# Patient Record
Sex: Female | Born: 1985 | Hispanic: No | Marital: Married | Smoking: Current some day smoker
Health system: Southern US, Community
[De-identification: ages and names within clinical notes are randomized; demographics above are authoritative.]

## PROBLEM LIST (undated history)

## (undated) DIAGNOSIS — T783XXA Angioneurotic edema, initial encounter: Secondary | ICD-10-CM

## (undated) DIAGNOSIS — J45909 Unspecified asthma, uncomplicated: Secondary | ICD-10-CM

## (undated) DIAGNOSIS — B009 Herpesviral infection, unspecified: Secondary | ICD-10-CM

## (undated) DIAGNOSIS — L509 Urticaria, unspecified: Secondary | ICD-10-CM

## (undated) HISTORY — PX: WISDOM TOOTH EXTRACTION: SHX21

## (undated) HISTORY — DX: Angioneurotic edema, initial encounter: T78.3XXA

## (undated) HISTORY — PX: LEEP: SHX91

## (undated) HISTORY — DX: Urticaria, unspecified: L50.9

## (undated) HISTORY — DX: Unspecified asthma, uncomplicated: J45.909

---

## 2009-01-03 ENCOUNTER — Other Ambulatory Visit: Admission: RE | Admit: 2009-01-03 | Discharge: 2009-01-03 | Payer: Self-pay | Admitting: Family Medicine

## 2009-11-12 ENCOUNTER — Emergency Department (HOSPITAL_COMMUNITY): Admission: EM | Admit: 2009-11-12 | Discharge: 2009-11-12 | Payer: Self-pay | Admitting: Family Medicine

## 2010-05-06 ENCOUNTER — Other Ambulatory Visit: Admission: RE | Admit: 2010-05-06 | Discharge: 2010-05-06 | Payer: Self-pay | Admitting: Family Medicine

## 2011-06-19 ENCOUNTER — Other Ambulatory Visit (HOSPITAL_COMMUNITY)
Admission: RE | Admit: 2011-06-19 | Discharge: 2011-06-19 | Disposition: A | Discharge status: Home / Self Care | Payer: Self-pay | Source: Ambulatory Visit | Attending: Family Medicine | Admitting: Family Medicine

## 2011-06-19 DIAGNOSIS — Z0000 Encounter for general adult medical examination without abnormal findings: Secondary | ICD-10-CM | POA: Insufficient documentation

## 2011-10-14 ENCOUNTER — Emergency Department (INDEPENDENT_AMBULATORY_CARE_PROVIDER_SITE_OTHER)
Admission: EM | Admit: 2011-10-14 | Discharge: 2011-10-14 | Disposition: A | Discharge status: Nursing Home (Includes ICF) | Payer: 59 | Source: Home / Self Care | Attending: Family Medicine | Admitting: Family Medicine

## 2011-10-14 ENCOUNTER — Encounter: Payer: Self-pay | Admitting: Emergency Medicine

## 2011-10-14 DIAGNOSIS — J069 Acute upper respiratory infection, unspecified: Secondary | ICD-10-CM

## 2011-10-14 HISTORY — DX: Herpesviral infection, unspecified: B00.9

## 2011-10-14 MED ORDER — HYDROCODONE-ACETAMINOPHEN 7.5-325 MG/15ML PO SOLN: 15.0000 mL | Freq: Three times a day (TID) | ORAL | Status: AC | PRN

## 2011-10-14 MED ORDER — FEXOFENADINE-PSEUDOEPHED ER 60-120 MG PO TB12: 1.0000 | ORAL_TABLET | Freq: Two times a day (BID) | ORAL | Status: AC

## 2011-10-14 MED ORDER — AZITHROMYCIN 250 MG PO TABS: 250.0000 mg | ORAL_TABLET | Freq: Every day | ORAL | Status: AC

## 2011-10-14 NOTE — ED Provider Notes (Signed)
History     CSN: [id number] Arrival date & time: 10/14/2011  5:35 PM   First MD Initiated Contact with Patient 10/14/11 1618      Chief Complaint  Patient presents with  . URI    (Consider location/radiation/quality/duration/timing/severity/associated sxs/prior treatment) Patient is a 25 y.o. female presenting with URI. The history is provided by the patient.  URI The primary symptoms include fever, headaches, sore throat, cough and myalgias. Primary symptoms do not include ear pain, swollen glands, wheezing, abdominal pain, nausea, vomiting or rash. Primary symptoms comment: has beed afebrile for 24 hours with no medications The current episode started 3 to 5 days ago. This is a new problem.  The headache is not associated with neck stiffness.  The onset of the illness is associated with exposure to sick contacts. Symptoms associated with the illness include sinus pressure, congestion and rhinorrhea.    Past Medical History  Diagnosis Date  . HSV infection     Past Surgical History  Procedure Date  . Leep   . Wisdom tooth extraction     Family History  Problem Relation Age of Onset  . Hypertension Mother   . Hyperlipidemia Mother   . Hyperlipidemia Father   . Hypertension Father   . Cancer Other     History  Substance Use Topics  . Smoking status: Never Smoker   . Smokeless tobacco: Not on file  . Alcohol Use: Yes    OB History    Grav Para Term Preterm Abortions TAB SAB Ect Mult Living                  Review of Systems  Constitutional: Positive for fever and appetite change.  HENT: Positive for congestion, sore throat, rhinorrhea and sinus pressure. Negative for ear pain, neck pain and neck stiffness.   Respiratory: Positive for cough. Negative for chest tightness, shortness of breath and wheezing.   Cardiovascular: Negative for chest pain.  Gastrointestinal: Negative for nausea, vomiting, abdominal pain and diarrhea.  Musculoskeletal: Positive for  myalgias.  Skin: Negative for rash.  Neurological: Positive for headaches.    Allergies  Review of patient's allergies indicates no known allergies.  Home Medications   Current Outpatient Rx  Name Route Sig Dispense Refill  . MULTIVITAMINS PO TABS Oral Take 1 tablet by mouth daily.      Marland Kitchen OVER THE COUNTER MEDICATION  Vitamin c and zinc and cough medicine     . AZITHROMYCIN 250 MG PO TABS Oral Take 1 tablet (250 mg total) by mouth daily. Take first 2 tablets together, then 1 every day until finished. 6 tablet 0    Hold prescription and only filly new onset of feve ...  . FEXOFENADINE-PSEUDOEPHED ER 60-120 MG PO TB12 Oral Take 1 tablet by mouth every 12 (twelve) hours. 30 tablet 0  . HYDROCODONE-ACETAMINOPHEN 7.5-325 MG/15ML PO SOLN Oral Take 15 mLs by mouth every 8 (eight) hours as needed for pain. 120 mL 0  . IBUPROFEN 800 MG PO TABS Oral Take 800 mg by mouth every 8 (eight) hours as needed.      Marland Kitchen VALACYCLOVIR HCL 500 MG PO TABS Oral Take 500 mg by mouth 2 (two) times daily.        BP 114/75  Pulse 86  Temp(Src) 98.3 F (36.8 C) (Oral)  Resp 20  SpO2 96%  LMP 10/14/2011  Physical Exam  Nursing note and vitals reviewed. Constitutional: She is oriented to person, place, and time. She appears well-developed and  well-nourished. No distress.  HENT:  Head: Normocephalic and atraumatic.       Nasal congestion with erythema and swelling of nasal turbinates. Reported bimaxillary sinus pressure. Significant pharyngeal erythema, no exudates. Uvula central. No trismus.  TMs increased vascular markings and some how dulness but no swelling or bulging.  Eyes: Conjunctivae and EOM are normal. Pupils are equal, round, and reactive to light.  Neck: Normal range of motion. Neck supple.  Cardiovascular: Normal rate, regular rhythm and normal heart sounds.   No murmur heard. Pulmonary/Chest: Effort normal and breath sounds normal. No respiratory distress. She has no wheezes. She has no rales.  She exhibits no tenderness.  Lymphadenopathy:    She has cervical adenopathy.  Neurological: She is alert and oriented to person, place, and time.  Skin: No rash noted.  Psychiatric: She has a normal mood and affect.    ED Course  Procedures (including critical care time)  Labs Reviewed - No data to display No results found.   1. URI (upper respiratory infection)       MDM  Likely viral URI as per patient request agreed to give a hold prescription for azithromycin that pt will fill if recurent fever and worsening symptoms after 7 days from day one of her symptoms.      Sharin Grave, MD 10/16/11 1229

## 2011-10-14 NOTE — ED Notes (Signed)
Fever--101 on saturday , cough, general aches.  Onset Saturday am.  Eating ok, tolerating po's

## 2012-07-08 ENCOUNTER — Other Ambulatory Visit (HOSPITAL_COMMUNITY)
Admission: RE | Admit: 2012-07-08 | Discharge: 2012-07-08 | Disposition: A | Discharge status: Home / Self Care | Payer: 59 | Source: Ambulatory Visit | Attending: Family Medicine | Admitting: Family Medicine

## 2012-07-08 DIAGNOSIS — Z0000 Encounter for general adult medical examination without abnormal findings: Secondary | ICD-10-CM | POA: Insufficient documentation

## 2012-07-09 ENCOUNTER — Other Ambulatory Visit: Payer: Self-pay | Admitting: Family Medicine

## 2012-07-09 DIAGNOSIS — Z87898 Personal history of other specified conditions: Secondary | ICD-10-CM

## 2012-07-09 DIAGNOSIS — N63 Unspecified lump in unspecified breast: Secondary | ICD-10-CM

## 2012-07-16 ENCOUNTER — Ambulatory Visit
Admission: RE | Admit: 2012-07-16 | Discharge: 2012-07-16 | Disposition: A | Discharge status: Home / Self Care | Payer: 59 | Source: Ambulatory Visit | Attending: Family Medicine | Admitting: Family Medicine

## 2012-07-16 DIAGNOSIS — N63 Unspecified lump in unspecified breast: Secondary | ICD-10-CM

## 2012-07-16 DIAGNOSIS — Z87898 Personal history of other specified conditions: Secondary | ICD-10-CM

## 2012-07-22 ENCOUNTER — Other Ambulatory Visit: Payer: Self-pay | Admitting: Family Medicine

## 2012-07-22 DIAGNOSIS — D249 Benign neoplasm of unspecified breast: Secondary | ICD-10-CM

## 2012-07-23 ENCOUNTER — Other Ambulatory Visit: Payer: 59

## 2012-07-26 ENCOUNTER — Ambulatory Visit
Admission: RE | Admit: 2012-07-26 | Discharge: 2012-07-26 | Disposition: A | Discharge status: Home / Self Care | Payer: 59 | Source: Ambulatory Visit | Attending: Family Medicine | Admitting: Family Medicine

## 2012-07-26 DIAGNOSIS — D249 Benign neoplasm of unspecified breast: Secondary | ICD-10-CM

## 2013-11-28 ENCOUNTER — Other Ambulatory Visit (HOSPITAL_COMMUNITY)
Admission: RE | Admit: 2013-11-28 | Discharge: 2013-11-28 | Disposition: A | Discharge status: Home / Self Care | Payer: 59 | Source: Ambulatory Visit | Attending: Family Medicine | Admitting: Family Medicine

## 2013-11-28 ENCOUNTER — Other Ambulatory Visit: Payer: Self-pay | Admitting: Family Medicine

## 2013-11-28 DIAGNOSIS — Z01419 Encounter for gynecological examination (general) (routine) without abnormal findings: Secondary | ICD-10-CM | POA: Insufficient documentation

## 2014-04-25 ENCOUNTER — Other Ambulatory Visit (HOSPITAL_COMMUNITY)
Admission: RE | Admit: 2014-04-25 | Discharge: 2014-04-25 | Disposition: A | Discharge status: Home / Self Care | Payer: 59 | Source: Ambulatory Visit | Attending: Family Medicine | Admitting: Family Medicine

## 2014-04-25 ENCOUNTER — Other Ambulatory Visit: Payer: Self-pay | Admitting: Family Medicine

## 2014-04-25 DIAGNOSIS — R87810 Cervical high risk human papillomavirus (HPV) DNA test positive: Secondary | ICD-10-CM | POA: Insufficient documentation

## 2014-04-25 DIAGNOSIS — Z01419 Encounter for gynecological examination (general) (routine) without abnormal findings: Secondary | ICD-10-CM | POA: Insufficient documentation

## 2014-04-25 DIAGNOSIS — Z1151 Encounter for screening for human papillomavirus (HPV): Secondary | ICD-10-CM | POA: Insufficient documentation

## 2014-04-27 LAB — CYTOLOGY - PAP

## 2014-06-05 ENCOUNTER — Other Ambulatory Visit: Payer: Self-pay | Admitting: Obstetrics & Gynecology

## 2015-06-07 ENCOUNTER — Other Ambulatory Visit: Payer: Self-pay | Admitting: Obstetrics & Gynecology

## 2015-06-07 ENCOUNTER — Other Ambulatory Visit (HOSPITAL_COMMUNITY)
Admission: RE | Admit: 2015-06-07 | Discharge: 2015-06-07 | Disposition: A | Discharge status: Home / Self Care | Payer: 59 | Source: Ambulatory Visit | Attending: Obstetrics and Gynecology | Admitting: Obstetrics and Gynecology

## 2015-06-07 DIAGNOSIS — Z01419 Encounter for gynecological examination (general) (routine) without abnormal findings: Secondary | ICD-10-CM | POA: Insufficient documentation

## 2015-06-11 LAB — CYTOLOGY - PAP

## 2015-11-27 DIAGNOSIS — Z23 Encounter for immunization: Secondary | ICD-10-CM | POA: Diagnosis not present

## 2016-05-07 DIAGNOSIS — D1039 Benign neoplasm of other parts of mouth: Secondary | ICD-10-CM | POA: Diagnosis not present

## 2016-07-07 ENCOUNTER — Telehealth: Payer: 59 | Admitting: Family

## 2016-07-07 DIAGNOSIS — J069 Acute upper respiratory infection, unspecified: Secondary | ICD-10-CM

## 2016-07-07 MED ORDER — AZITHROMYCIN 250 MG PO TABS: ORAL_TABLET | ORAL | 0 refills | Status: DC

## 2016-07-07 MED ORDER — BENZONATATE 100 MG PO CAPS: 100.0000 mg | ORAL_CAPSULE | Freq: Two times a day (BID) | ORAL | 0 refills | Status: DC | PRN

## 2016-07-07 NOTE — Addendum Note (Signed)
Addended by: Evelina Dun A on: 07/07/2016 07:39 PM   Modules accepted: Orders

## 2016-07-07 NOTE — Progress Notes (Signed)

## 2016-07-17 DIAGNOSIS — Z01411 Encounter for gynecological examination (general) (routine) with abnormal findings: Secondary | ICD-10-CM | POA: Diagnosis not present

## 2016-07-17 DIAGNOSIS — R3915 Urgency of urination: Secondary | ICD-10-CM | POA: Diagnosis not present

## 2016-08-21 DIAGNOSIS — Z0000 Encounter for general adult medical examination without abnormal findings: Secondary | ICD-10-CM | POA: Diagnosis not present

## 2016-08-21 DIAGNOSIS — E559 Vitamin D deficiency, unspecified: Secondary | ICD-10-CM | POA: Diagnosis not present

## 2016-09-04 DIAGNOSIS — E559 Vitamin D deficiency, unspecified: Secondary | ICD-10-CM | POA: Diagnosis not present

## 2016-09-04 DIAGNOSIS — Z13220 Encounter for screening for lipoid disorders: Secondary | ICD-10-CM | POA: Diagnosis not present

## 2016-09-04 DIAGNOSIS — Z131 Encounter for screening for diabetes mellitus: Secondary | ICD-10-CM | POA: Diagnosis not present

## 2016-09-04 DIAGNOSIS — Z0000 Encounter for general adult medical examination without abnormal findings: Secondary | ICD-10-CM | POA: Diagnosis not present

## 2016-09-04 LAB — VITAMIN D 25 HYDROXY (VIT D DEFICIENCY, FRACTURES): Vit D, 25-Hydroxy: 28.2

## 2016-09-04 LAB — LIPID PANEL
CHOLESTEROL: 150 (ref 0–200)
HDL: 58 (ref 35–70)
LDL Cholesterol: 79
Triglycerides: 60 (ref 40–160)

## 2016-09-04 LAB — BASIC METABOLIC PANEL: Glucose: 90

## 2018-06-07 ENCOUNTER — Ambulatory Visit (INDEPENDENT_AMBULATORY_CARE_PROVIDER_SITE_OTHER): Payer: 59 | Admitting: Family Medicine

## 2018-06-07 ENCOUNTER — Encounter: Payer: Self-pay | Admitting: Family Medicine

## 2018-06-07 VITALS — BP 119/76 | HR 68 | Ht 66.0 in | Wt 130.9 lb

## 2018-06-07 DIAGNOSIS — R062 Wheezing: Secondary | ICD-10-CM | POA: Diagnosis not present

## 2018-06-07 DIAGNOSIS — Z716 Tobacco abuse counseling: Secondary | ICD-10-CM

## 2018-06-07 DIAGNOSIS — J3089 Other allergic rhinitis: Secondary | ICD-10-CM | POA: Insufficient documentation

## 2018-06-07 DIAGNOSIS — F17209 Nicotine dependence, unspecified, with unspecified nicotine-induced disorders: Secondary | ICD-10-CM

## 2018-06-07 NOTE — Progress Notes (Signed)
New patient office visit note:  Impression and Recommendations:    1. Mild Exp Wheezes   2. Environmental and seasonal allergies   3. Tobacco use disorder, continuous   4. Tobacco abuse counseling      Lifestyle Please realize, EXERCISE IS MEDICINE! -  American Heart Association [facility]) guidelines for exercise : you should engage in 150 minutes of moderate intensity aerobic activity per week.  - Engaging in regular exercise will improve brain function and memory, as well as improve mood, boost immune system and help with weight management.   - Other beneficial effects of exercise: decreasing blood sugar levels, decreasing blood pressure,  and decreasing bad cholesterol levels/ increasing good cholesterol levels.   -  The AHA strongly endorses consumption of a diet that contains a variety of foods from all the food categories with an emphasis on fruits and vegetables; fat-free and low-fat dairy products; cereal and grain products; legumes and nuts; and fish, poultry, and/or extra lean meats.     - Excessive food intake, especially of foods high in saturated and trans fats, sugar, and salt, should be avoided.     - Adequate water intake of roughly 1/2 of your weight in pounds, should equal the ounces of water per day you should drink.  So for instance, if you're 200 pounds, that would be 100 ounces of water per day -Set goals with patient for at least 30 min walking per day and only 1-2 glasses of wine per weeknight   Smoking -Told pt to think seriously about quitting smoking!  Told pt it is very important for his/her health and well being.    -Smoking cessation instruction/counseling given:  counseled patient on the dangers of tobacco use, advised patient to stop smoking, and reviewed strategies to maximize success  -Discussed with patient that there are multiple treatments to aid in quitting smoking, however I explained none will work unless pt really want to quit  -Told to call  1-800-QUIT-NOW 5391813921) for free smoking cessation counseling and support, or pt can go online to www.heart.org - the American Heart Association website and search "quit smoking ".   Medical Management -Requested patient create a follow up appointment for fasting blood work, Melissa asked in input future labs -Discussed specific tests covered for patient awareness  -Set goals with patient to decrease alcohol use to 1-2 glasses per day and exercise 30 min per day  Gynecology -Discussed importance of regular PAP smears especially with abnormal results in the past -Counseled patient about possible immune suppressive therapy if increase in outbreaks of genital warts -Counseled patient on importance of seeing an OBGYN if future abnormal results from PAP smear occur  Breathing/allergies -Discussed OTC options for managing allergies including medications  -Discussed non-medicinal options for allergy issues such as air purifier and cleaning rugs -You can use over-the-counter afrin nasal spray for up to 3 days (NO longer than that) which will help acutely with nasal drainage/ congestion short term.     -Also, sterile saline nasal rinses, such as Milta Deiters med or AYR sinus rinses, can be very helpful and should be done twice daily- especially throughout the allergy season.   Remember you should use distilled water or previously boiled water to do this.  Then you may use over-the-counter Flonase 1 spray each nostril twice daily after sinus rinses.  You can do this in addition to taking any Allegra or Claritin or Zyrtec etc. that you may be taking daily.  -If  your eyes tend to get an itchy or irritated feeling when your seasonal allergies get bad, you can use Naphcon-A over-the-counter eyedrops as needed  Pt was in the office today for 32.5+ minutes, with over 50% time spent in face to face counseling of patients various medical conditions, treatment plans of those medical conditions including medicine  management and lifestyle modification, strategies to improve health and well being; and in coordination of care. SEE ABOVE TREATMENT PLAN FOR DETAILS     Education and routine counseling performed. Handouts provided.  Gross side effects, risk and benefits, and alternatives of medications discussed with patient.  Patient is aware that all medications have potential side effects and we are unable to predict every side effect or drug-drug interaction that may occur.  Expresses verbal understanding and consents to current therapy plan and treatment regimen.  Return for Chronic OV w me near future & FBW 2-3d prior.  Please see AVS handed out to patient at the end of our visit for further patient instructions/ counseling done pertaining to today's office visit.    Note: This document was prepared using Dragon voice recognition software and may include unintentional dictation errors.     This document serves as a record of services personally performed by Mellody Dance, MD. It was created on her behalf by Georga Bora, a trained medical scribe. The creation of this record is based on the scribe's personal observations and the provider's statements to them.   I have reviewed the above medical documentation for accuracy and completeness and I concur.  Mellody Dance 06/17/18 1:15 PM    -------------------------------------------------------------------------------------------------------------    Subjective:    Chief complaint:   Chief Complaint  Patient presents with  . Establish Care     HPI: Carolyn Lawrence is a pleasant 32 y.o. female who presents to [place] [facility] today to review their medical history with me and establish care.   I asked the patient to review their chronic problem list with me to ensure everything was updated and accurate.    All recent office visits with other providers, any medical records that patient brought in etc  - I  reviewed today.     We asked pt to get [place] their medical records from [facility] providers/ specialists that they had seen within the past 3-5 years- if they are in private practice and/or do not work for [employer], [facility], [place], [place] or [employer] owned practice.  Told them to call their specialists to clarify this if they are not sure.    Social History -Smoked roughly a pack a day for 5-6 years; currently smokes roughly 1/2 pack per day and struggles to quit -Smokes to allow herself to relax and to "shut down her busy brain" in order to sleep -Drinks alcohol, drinks wine at night. "Let's just say 4 glasses" per day  Family History -Mother and sister have struggled with alcohol addiction -Grandmother had breast cancer in roughly her 29's with double mastectomy -2 aunts have had breast cancer requiring mastectomy -Grandfather died in early 73s, HTL, HLD, CAD -Parents have HTN, HLD -Paternal grandmother had DM in 16's -Mother suffered from depression and anxiety  Medical History -Has had "spells" of being down, but no depression diagnosis or medication  Allergies/Breathing -States she has had issues with wheezing, "sometimes severely in the mornings" within the last year -She declines previous breathing issues or asthma -States she has had issues with allergies more frequently within last year -  PT States she has some "post-nasal drip"  Surgical History -States she had LEEP procedure done for abnormal PAP smear  Gynecology -Patient states she has "genital herpes" and has HPV -History of abnormal PAP smears -Last two PAP smears have been normal since LEEP procdeure -OBGYN is Dr. Nelda Marseille -One sexual partner -Takes valtrex PRN; roughly 3 outbreaks per year -States she has strong back cramps with menstruation -Patient does not want children  Lifestyle -Patient is a Marine scientist in a step-down ICU; also works from home for [employer] -Born near [place] and moved to [place] for [place] she enjoys the slower pace of working at home -Has a Oceanographer in nursing as well -Dating current boyfriend for 7 years -[place] for relaxation -Patient states she "doesn't exercise regularly" and has concerns about weight gain with new sedentary lifestyle     Wt Readings from Last 3 Encounters:  06/07/18 130 lb 14.4 oz (59.4 kg)   BP Readings from Last 3 Encounters:  06/07/18 119/76  10/14/11 114/75   Pulse Readings from Last 3 Encounters:  06/07/18 68  10/14/11 86   BMI Readings from Last 3 Encounters:  06/07/18 21.13 kg/m    Patient Care Team    Relationship Specialty Notifications Start End  Mellody Dance, DO PCP - General Family Medicine  05/05/18   Janyth Pupa, DO Consulting Physician Obstetrics and Gynecology  06/07/18     Patient Active Problem List   Diagnosis Date Noted  . Environmental and seasonal allergies 06/07/2018  . Tobacco use disorder, continuous 06/07/2018  . Tobacco abuse counseling 06/07/2018  . Mild Exp Wheezes 06/07/2018     Past Medical History:  Diagnosis Date  . HSV infection      Past Medical History:  Diagnosis Date  . HSV infection      Past Surgical History:  Procedure Laterality Date  . LEEP    . WISDOM TOOTH EXTRACTION       Family History  Problem Relation Age of Onset  . Hypertension Mother   . Hyperlipidemia Mother   . Alcoholism Mother   . Hyperlipidemia Father   . Hypertension Father   . Cancer Other   . Alcoholism Sister   . High Cholesterol Maternal Grandmother   . Breast cancer Maternal Grandmother   . Diabetes Paternal Grandmother   . High Cholesterol Paternal Grandmother   . Stroke Paternal Grandmother   . Heart attack Paternal Grandfather   . High Cholesterol Paternal Grandfather      Social History   Substance and Sexual Activity  Drug Use No     Social History   Substance and Sexual Activity  Alcohol Use Yes  . Alcohol/week: 10.0 - 15.0 standard drinks  . Types: 10 -  15 Standard drinks or equivalent per week     Social History   Tobacco Use  Smoking Status Current Every Day Smoker  . Packs/day: 0.25  . Years: 15.00  . Pack years: 3.75  . Types: Cigarettes  Smokeless Tobacco Never Used     Current Meds  Medication Sig  . ibuprofen (ADVIL,MOTRIN) 800 MG tablet Take 800 mg by mouth every 8 (eight) hours as needed.    . valACYclovir (VALTREX) 500 MG tablet Take 500 mg by mouth 2 (two) times daily.      Allergies: Patient has no known allergies.   Review of Systems  Constitutional: Negative for chills, diaphoresis, fever, malaise/fatigue and weight loss.  HENT: Negative for congestion, sore throat and tinnitus.  Eyes: Negative for blurred vision, double vision and photophobia.  Respiratory: Negative for cough and wheezing.   Cardiovascular: Negative for chest pain and palpitations.  Gastrointestinal: Negative for blood in stool, diarrhea, nausea and vomiting.  Genitourinary: Negative for dysuria, frequency and urgency.  Musculoskeletal: Negative for joint pain and myalgias.  Skin: Negative for itching and rash.  Neurological: Negative for dizziness, focal weakness, weakness and headaches.  Endo/Heme/Allergies: Negative for environmental allergies and polydipsia. Does not bruise/bleed easily.  Psychiatric/Behavioral: Negative for depression and memory loss. The patient is not nervous/anxious and does not have insomnia.      Objective:   Blood pressure 119/76, pulse 68, height 5\' 6"  (1.676 m), weight 130 lb 14.4 oz (59.4 kg), SpO2 98 %. Body mass index is 21.13 kg/m. General: Well Developed, well nourished, and in no acute distress.  Neuro: Alert and oriented x3, extra-ocular muscles intact, sensation grossly intact.  HEENT:[place]/AT, PERRLA, neck supple, No carotid bruits Skin: no gross rashes  Cardiac: Regular rate and rhythm Respiratory: Not using accessory muscles, speaking in full sentences.  One small expiratory wheeze on  expiration only in bilateral anterior upper lung field  Abdominal: not grossly distended Musculoskeletal: Ambulates w/o diff, FROM * 4 ext.  Vasc: less 2 sec cap RF, warm and pink  Psych:  No HI/SI, judgement and insight good, Euthymic mood. Full Affect.    No results found for this or any previous visit (from the past 2160 hour(s)).

## 2018-06-07 NOTE — Patient Instructions (Addendum)
I  n addition to below please consider adding a small HEPA filter to your bedroom to decrease circulating allergens.     Also, sterile saline nasal rinses, such as Milta Deiters med or AYR sinus rinses, can be very helpful and should be done twice daily- especially throughout the allergy season.   Remember you should use distilled water or previously boiled water to do this.  Then you may use over-the-counter Flonase 1 spray each nostril twice daily after sinus rinses.  You can do this in addition to taking any Allegra or Claritin or Zyrtec etc. that you may be taking daily.  If your eyes tend to get an itchy or irritated feeling when your seasonal allergies get bad, you can use Naphcon-A over-the-counter eyedrops as needed   Goals: Decrease wine to max of 2 glasses per week night At least 30 min of cardio each day   Please realize, EXERCISE IS MEDICINE!  -  American Heart Association [facility]) guidelines for exercise : If you are in good health, without any medical conditions, you should engage in 150 minutes of moderate intensity aerobic activity per week.  This means you should be huffing and puffing throughout your workout.   Engaging in regular exercise will improve brain function and memory, as well as improve mood, boost immune system and help with weight management.  As well as the other, more well-known effects of exercise such as decreasing blood sugar levels, decreasing blood pressure,  and decreasing bad cholesterol levels/ increasing good cholesterol levels.     -  The AHA strongly endorses consumption of a diet that contains a variety of foods from all the food categories with an emphasis on fruits and vegetables; fat-free and low-fat dairy products; cereal and grain products; legumes and nuts; and fish, poultry, and/or extra lean meats.    Excessive food intake, especially of foods high in saturated and trans fats, sugar, and salt, should be avoided.    Adequate water intake of roughly 1/2 of  your weight in pounds, should equal the ounces of water per day you should drink.  So for instance, if you're 200 pounds, that would be 100 ounces of water per day.         Mediterranean Diet  Why follow it? Research shows  Those who follow the Mediterranean diet have a reduced risk of heart disease   The diet is associated with a reduced incidence of Parkinson's and Alzheimer's diseases  People following the diet may have longer life expectancies and lower rates of chronic diseases   The Dietary Guidelines for Americans recommends the Mediterranean diet as an eating plan to promote health and prevent disease  What Is the Mediterranean Diet?   Healthy eating plan based on typical foods and recipes of Mediterranean-style cooking  The diet is primarily a plant based diet; these foods should make up a majority of meals   Starches - Plant based foods should make up a majority of meals - They are an important sources of vitamins, minerals, energy, antioxidants, and fiber - Choose whole grains, foods high in fiber and minimally processed items  - Typical grain sources include wheat, oats, barley, corn, brown rice, bulgar, farro, millet, polenta, couscous  - Various types of beans include chickpeas, lentils, fava beans, black beans, white beans   Fruits  Veggies - Large quantities of antioxidant rich fruits & veggies; 6 or more servings  - Vegetables can be eaten raw or lightly drizzled with oil and cooked  -  Vegetables common to the traditional Mediterranean Diet include: artichokes, arugula, beets, broccoli, brussel sprouts, cabbage, carrots, celery, collard greens, cucumbers, eggplant, kale, leeks, lemons, lettuce, mushrooms, okra, onions, peas, peppers, potatoes, pumpkin, radishes, rutabaga, shallots, spinach, sweet potatoes, turnips, zucchini - Fruits common to the Mediterranean Diet include: apples, apricots, avocados, cherries, clementines, dates, figs, grapefruits, grapes, melons,  nectarines, oranges, peaches, pears, pomegranates, strawberries, tangerines  Fats - Replace butter and margarine with healthy oils, such as olive oil, canola oil, and tahini  - Limit nuts to no more than a handful a day  - Nuts include walnuts, almonds, pecans, pistachios, pine nuts  - Limit or avoid candied, honey roasted or heavily salted nuts - Olives are central to the [employer] - can be eaten whole or used in a variety of dishes   Meats Protein - Limiting red meat: no more than a few times a month - When eating red meat: choose lean cuts and keep the portion to the size of deck of cards - Eggs: approx. 0 to 4 times a week  - Fish and lean poultry: at least 2 a week  - Healthy protein sources include, chicken, [place], lean beef, lamb - Increase intake of seafood such as tuna, salmon, trout, mackerel, shrimp, scallops - Avoid or limit high fat processed meats such as sausage and bacon  Dairy - Include moderate amounts of low fat dairy products  - Focus on healthy dairy such as fat free yogurt, skim milk, low or reduced fat cheese - Limit dairy products higher in fat such as whole or 2% milk, cheese, ice cream  Alcohol - Moderate amounts of red wine is ok  - No more than 5 oz daily for women (all ages) and men older than age 6  - No more than 10 oz of wine daily for men younger than 26  Other - Limit sweets and other desserts  - Use herbs and spices instead of salt to flavor foods  - Herbs and spices common to the traditional Mediterranean Diet include: basil, bay leaves, chives, cloves, cumin, fennel, garlic, lavender, marjoram, mint, oregano, parsley, pepper, rosemary, sage, savory, sumac, tarragon, thyme   Its not just a diet, its a lifestyle:   The Mediterranean diet includes lifestyle factors typical of those in the region   Foods, drinks and meals are best eaten with others and savored  Daily physical activity is important for overall good health  This could be  strenuous exercise like running and aerobics  This could also be more leisurely activities such as walking, housework, yard-work, or taking the stairs  Moderation is the key; a balanced and healthy diet accommodates most foods and drinks  Consider portion sizes and frequency of consumption of certain foods   Meal Ideas & Options:   Breakfast:  o Whole wheat toast or whole wheat English muffins with peanut butter & hard boiled egg o Steel cut oats topped with apples & cinnamon and skim milk  o Fresh fruit: banana, strawberries, melon, berries, peaches  o Smoothies: strawberries, bananas, greek yogurt, peanut butter o Low fat greek yogurt with blueberries and granola  o Egg white omelet with spinach and mushrooms o Breakfast couscous: whole wheat couscous, apricots, skim milk, cranberries   Sandwiches:  o Hummus and grilled vegetables (peppers, zucchini, squash) on whole wheat bread   o Grilled chicken on whole wheat pita with lettuce, tomatoes, cucumbers or tzatziki  o Tuna salad on whole wheat bread: tuna salad made with greek yogurt,  olives, red peppers, capers, green onions o Garlic rosemary lamb pita: lamb sauted with garlic, rosemary, salt & pepper; add lettuce, cucumber, greek yogurt to pita - flavor with lemon juice and black pepper   Seafood:  o Mediterranean grilled salmon, seasoned with garlic, basil, parsley, lemon juice and black pepper o Shrimp, lemon, and spinach whole-grain pasta salad made with low fat greek yogurt  o Seared scallops with lemon orzo  o Seared tuna steaks seasoned salt, pepper, coriander topped with tomato mixture of olives, tomatoes, olive oil, minced garlic, parsley, green onions and cappers   Meats:  o Herbed greek chicken salad with kalamata olives, cucumber, feta  o Red bell peppers stuffed with spinach, bulgur, lean ground beef (or lentils) & topped with feta   o Kebabs: skewers of chicken, tomatoes, onions, zucchini, squash  o [place] burgers:  made with red onions, mint, dill, lemon juice, feta cheese topped with roasted red peppers  Vegetarian o Cucumber salad: cucumbers, artichoke hearts, celery, red onion, feta cheese, tossed in olive oil & lemon juice  o Hummus and whole grain pita points with a greek salad (lettuce, tomato, feta, olives, cucumbers, red onion) o Lentil soup with celery, carrots made with vegetable broth, garlic, salt and pepper  o Tabouli salad: parsley, bulgur, mint, scallions, cucumbers, tomato, radishes, lemon juice, olive oil, salt and pepper.

## 2018-06-22 ENCOUNTER — Encounter: Payer: Self-pay | Admitting: Family Medicine

## 2018-06-22 DIAGNOSIS — R87810 Cervical high risk human papillomavirus (HPV) DNA test positive: Secondary | ICD-10-CM | POA: Insufficient documentation

## 2018-06-22 DIAGNOSIS — E559 Vitamin D deficiency, unspecified: Secondary | ICD-10-CM | POA: Insufficient documentation

## 2018-06-22 DIAGNOSIS — B009 Herpesviral infection, unspecified: Secondary | ICD-10-CM | POA: Insufficient documentation

## 2018-06-22 DIAGNOSIS — N6012 Diffuse cystic mastopathy of left breast: Secondary | ICD-10-CM

## 2018-06-22 DIAGNOSIS — D242 Benign neoplasm of left breast: Secondary | ICD-10-CM | POA: Insufficient documentation

## 2018-06-22 DIAGNOSIS — N6011 Diffuse cystic mastopathy of right breast: Secondary | ICD-10-CM | POA: Insufficient documentation

## 2018-07-15 ENCOUNTER — Other Ambulatory Visit: Payer: 59

## 2018-07-15 DIAGNOSIS — E559 Vitamin D deficiency, unspecified: Secondary | ICD-10-CM | POA: Diagnosis not present

## 2018-07-15 DIAGNOSIS — Z0000 Encounter for general adult medical examination without abnormal findings: Secondary | ICD-10-CM | POA: Diagnosis not present

## 2018-07-16 LAB — T4, FREE: FREE T4: 1.16 ng/dL (ref 0.82–1.77)

## 2018-07-16 LAB — CBC WITH DIFFERENTIAL/PLATELET
BASOS: 1 %
Basophils Absolute: 0 10*3/uL (ref 0.0–0.2)
EOS (ABSOLUTE): 0.3 10*3/uL (ref 0.0–0.4)
EOS: 5 %
HEMATOCRIT: 44.1 % (ref 34.0–46.6)
HEMOGLOBIN: 15.1 g/dL (ref 11.1–15.9)
IMMATURE GRANS (ABS): 0 10*3/uL (ref 0.0–0.1)
Immature Granulocytes: 0 %
LYMPHS: 25 %
Lymphocytes Absolute: 1.8 10*3/uL (ref 0.7–3.1)
MCH: 30.8 pg (ref 26.6–33.0)
MCHC: 34.2 g/dL (ref 31.5–35.7)
MCV: 90 fL (ref 79–97)
MONOCYTES: 9 %
Monocytes Absolute: 0.7 10*3/uL (ref 0.1–0.9)
NEUTROS ABS: 4.3 10*3/uL (ref 1.4–7.0)
Neutrophils: 60 %
Platelets: 190 10*3/uL (ref 150–450)
RBC: 4.9 x10E6/uL (ref 3.77–5.28)
RDW: 13 % (ref 12.3–15.4)
WBC: 7.1 10*3/uL (ref 3.4–10.8)

## 2018-07-16 LAB — HEMOGLOBIN A1C
Est. average glucose Bld gHb Est-mCnc: 103 mg/dL
Hgb A1c MFr Bld: 5.2 % (ref 4.8–5.6)

## 2018-07-16 LAB — LIPID PANEL
CHOL/HDL RATIO: 2.6 ratio (ref 0.0–4.4)
Cholesterol, Total: 139 mg/dL (ref 100–199)
HDL: 53 mg/dL (ref >39)
LDL Calculated: 63 mg/dL (ref 0–99)
TRIGLYCERIDES: 114 mg/dL (ref 0–149)
VLDL CHOLESTEROL CAL: 23 mg/dL (ref 5–40)

## 2018-07-16 LAB — COMPREHENSIVE METABOLIC PANEL
A/G RATIO: 2.3 — AB (ref 1.2–2.2)
ALBUMIN: 4.3 g/dL (ref 3.5–5.5)
ALT: 11 IU/L (ref 0–32)
AST: 20 IU/L (ref 0–40)
Alkaline Phosphatase: 58 IU/L (ref 39–117)
BILIRUBIN TOTAL: 0.8 mg/dL (ref 0.0–1.2)
BUN / CREAT RATIO: 13 (ref 9–23)
BUN: 10 mg/dL (ref 6–20)
CALCIUM: 9 mg/dL (ref 8.7–10.2)
CO2: 22 mmol/L (ref 20–29)
Chloride: 104 mmol/L (ref 96–106)
Creatinine, Ser: 0.8 mg/dL (ref 0.57–1.00)
GFR, EST AFRICAN AMERICAN: 111 mL/min/{1.73_m2} (ref >59)
GFR, EST NON AFRICAN AMERICAN: 96 mL/min/{1.73_m2} (ref >59)
GLOBULIN, TOTAL: 1.9 g/dL (ref 1.5–4.5)
Glucose: 91 mg/dL (ref 65–99)
POTASSIUM: 4.3 mmol/L (ref 3.5–5.2)
Sodium: 142 mmol/L (ref 134–144)
Total Protein: 6.2 g/dL (ref 6.0–8.5)

## 2018-07-16 LAB — TSH: TSH: 1.74 u[IU]/mL (ref 0.450–4.500)

## 2018-07-16 LAB — VITAMIN D 25 HYDROXY (VIT D DEFICIENCY, FRACTURES): Vit D, 25-Hydroxy: 36.9 ng/mL (ref 30.0–100.0)

## 2018-07-20 ENCOUNTER — Ambulatory Visit: Payer: 59 | Admitting: Family Medicine

## 2018-07-21 ENCOUNTER — Encounter: Payer: Self-pay | Admitting: Family Medicine

## 2018-07-21 ENCOUNTER — Ambulatory Visit (INDEPENDENT_AMBULATORY_CARE_PROVIDER_SITE_OTHER): Payer: 59 | Admitting: Family Medicine

## 2018-07-21 VITALS — BP 111/78 | HR 75 | Ht 66.0 in | Wt 127.0 lb

## 2018-07-21 DIAGNOSIS — Z716 Tobacco abuse counseling: Secondary | ICD-10-CM

## 2018-07-21 DIAGNOSIS — F17209 Nicotine dependence, unspecified, with unspecified nicotine-induced disorders: Secondary | ICD-10-CM | POA: Diagnosis not present

## 2018-07-21 DIAGNOSIS — E559 Vitamin D deficiency, unspecified: Secondary | ICD-10-CM | POA: Diagnosis not present

## 2018-07-21 DIAGNOSIS — R062 Wheezing: Secondary | ICD-10-CM

## 2018-07-21 DIAGNOSIS — J3089 Other allergic rhinitis: Secondary | ICD-10-CM

## 2018-07-21 MED ORDER — CALCIUM CARBONATE-VITAMIN D 600-400 MG-UNIT PO TABS: 1.0000 | ORAL_TABLET | Freq: Two times a day (BID) | ORAL | 11 refills | Status: DC

## 2018-07-21 NOTE — Progress Notes (Signed)
Assessment and plan:  1. Vitamin D insufficiency   2. Environmental and seasonal allergies   3. Tobacco use disorder, continuous   4. Tobacco abuse counseling   5. Mild Exp Wheezes     Wheezing -Educated pt about environmental affects on breathing, such as mold, carpeting, dust and allergies -Discussed ways to improve air quality in her home, including dehumidifiers and air purifiers -Discussed how childhood environments can impact long term lung health, such as parental smoking or childhood asthma -Discussed harmful impact of smoking on lung health and susceptibility to environment -Discussed CAD Ratings and space limitations on air purifiers -Encouraged pt to begin exercising regularly to help open airways  Tobacco Abuse -Encouraged pt to stop using tobacco products -Discussed negative impact of smoking on lung health and breathing -Handouts offered on tobacco cessation -Discussed possible long term negative impact of smoking around children  Vitamin D -Began OTC Vitamin D supplements of 1000-2000 IU per day -Recheck in 4-6 months -Explained goal range of 50 - 60 ng/mL for patient -Discussed role of Vitamin D in bone health and calcium uptake -Educated pt on impact of low Vitamin D in fatigue and energy levels -Discussed ways to increase Vitamin D levels including daily supplements, spending time outdoors, and regular exercise to decrease accumulation of adipose tissue  Thyroid -Discussed method of interaction between brain and thyroid -Discussed healthy levels of TSH, T3 and T4  -Educated pt on different types of hypothyroidism including Hasihmoto's dosease  Metabolic testing -Began OTC Calcium/VitaminD supplement -Discussed meanings kidney function metrics including BUN, Ser/creatinine -Discussed meaning of blood glucose levels in health -Discussed calcium ranges and methods of increasing daily calcium  intake -Encouraged pt to consider a daily multivitamin and dietary changes such as more leafy greens -Discussed liver function metrics including AST, ALT, alkaline phosphatase and albumin/globulin ratio  A1C -Discussed role of A1C in diagnosing diabetes -Educated pt on healthy levels of A1C  Cholesterol -Discussed role saturated and trans fats in increasing cholesterol levels -Educated pt on reading nutrition labels to find information about fat levels in food -Educated pt on linkage between elevated LDL and decreased cardiovascular health -Educated pt on what foods affect LDL and triglyceride levels -Discussed ways to improve cholesterol levels such as dietary and exercise changes -Educated pt on unsaturated and healthy fats, such as nuts and olive oil -Discussed role of HDL in health and ways to increase levels, such as increasing exercise and fish oil supplements -Encouraged pt to avoid fatty carbs such as fried breads in order to prevent increasing triglyceride levels  Exercise -Encouraged pt to begin exercising each day  -Discussed AHA recommendations for 30 min of cardio exercise several times a week -Educated pt on ways to slowly increase exercise levels, such as walking and separating exercise into two sessions   Gynecology -Discussed need for regular PAP smears  -Encouraged pt to find last PAP smear in medical records and make an appointment for follow up if over 3 years has passed - She would need to see GYN to discuss options for permanent sterilization  Pt was in the office today for 32.5+ minutes, with over 50% time spent in face to face counseling of patients various medical conditions, treatment plans of those medical conditions including medicine management and lifestyle modification, strategies to improve health and well being; and in coordination of care. SEE ABOVE TREATMENT PLAN FOR DETAILS    Education and routine counseling performed. Handouts  provided.   Anticipatory guidance  and routine counseling done re: condition, txmnt options and need for follow up. All questions of patient's were answered.   Gross side effects, risk and benefits, and alternatives of medications discussed with patient.  Patient is aware that all medications have potential side effects and we are unable to predict every sideeffect or drug-drug interaction that may occur.  Expresses verbal understanding and consents to current therapy plan and treatment regiment.  Please see AVS handed out to patient at the end of our visit for additional patient instructions/ counseling done pertaining to today's office visit.  Note: This document was prepared using Dragon voice recognition software and may include unintentional dictation errors.    This document serves as a record of services personally performed by Mellody Dance, MD. It was created on her behalf by Georga Bora, a trained medical scribe. The creation of this record is based on the scribe's personal observations and the provider's statements to them.   I have reviewed the above medical documentation for accuracy and completeness and I concur.  Mellody Dance 07/22/18 4:16 PM   ----------------------------------------------------------------------------------------------------------------------  Subjective:   CC:   Carolyn Lawrence is a 32 y.o. female who presents to [place] at [facility] today for review and discussion of recent bloodwork that was done.  All recent blood work that we ordered was reviewed with patient today.  Patient was counseled on all abnormalities and we discussed dietary and lifestyle changes that could help those values (also medications when appropriate).  Extensive health counseling performed and all patient's concerns/ questions were addressed.   Breathing -Pt states she had a URI recently and noticed an increase in her wheezing; says it has improved now  that she is over the cold -Pt has not quit smoking but doesn't [place] vaping products -States her parents smoked in her home throughout her childhood    Alcohol Use -Pt states she has made changes to her alcohol intake  -She has been sticking to 2 or fewer drinks per day, as well as decreasing her wine intake  Lab Reports 5 Sept 2019 Thyroid -Pt mother has hypothyroidism -Thyroid hormones WNL  CBC -WBC 7.1 -RBC 7.82  Metabolic BUN at 10, WNL Ser/Creatinine 0.80, WNL Glucose 91, WNL Albumin/globulin ratio 2.3, high normal AST 20, WNL ALT 11, WNL  Vitamin D 36.9, Low normal  A1C 5.2, WNL  Cholesterol -LDL 63, WNL -HDL 53, WNL -VLDL 23, WNL -Triglycerides 114 WNL  Gynecology -Pt does not remember last pap smear, but states she will check  -Pt is interested in permanent sterilization, and does not want to have children -Pt asked what steps she needs to take in order to move forward with sterilization  Wt Readings from Last 3 Encounters:  07/21/18 127 lb (57.6 kg)  06/07/18 130 lb 14.4 oz (59.4 kg)   BP Readings from Last 3 Encounters:  07/21/18 111/78  06/07/18 119/76  10/14/11 114/75   Pulse Readings from Last 3 Encounters:  07/21/18 75  06/07/18 68  10/14/11 86   BMI Readings from Last 3 Encounters:  07/21/18 20.50 kg/m  06/07/18 21.13 kg/m     Patient Care Team    Relationship Specialty Notifications Start End  Mellody Dance, DO PCP - General Family Medicine  05/05/18   Janyth Pupa, DO Consulting Physician Obstetrics and Gynecology  06/07/18     Full medical history updated and reviewed in the office today  Patient Active Problem List   Diagnosis Date Noted  . Cervical  high risk HPV (human papillomavirus) test positive- 04/2014 06/22/2018  . Vitamin D deficiency 06/22/2018  . Fibrocystic breast changes, bilateral 06/22/2018  . 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 HSV infection 06/22/2018  . Environmental and seasonal  allergies 06/07/2018  . Tobacco use disorder, continuous 06/07/2018  . Tobacco abuse counseling 06/07/2018  . Mild Exp Wheezes 06/07/2018    Past Medical History:  Diagnosis Date  . HSV infection     Past Surgical History:  Procedure Laterality Date  . LEEP    . WISDOM TOOTH EXTRACTION      Social History   Tobacco Use  . Smoking status: Current Every Day Smoker    Packs/day: 0.25    Years: 15.00    Pack years: 3.75    Types: Cigarettes  . Smokeless tobacco: Never Used  Substance Use Topics  . Alcohol use: Yes    Alcohol/week: 10.0 - 15.0 standard drinks    Types: 10 - 15 Standard drinks or equivalent per week    Family Hx: Family History  Problem Relation Age of Onset  . Hypertension Mother   . Hyperlipidemia Mother   . Alcoholism Mother   . Hyperlipidemia Father   . Hypertension Father   . Cancer Other   . Alcoholism Sister   . High Cholesterol Maternal Grandmother   . Breast cancer Maternal Grandmother   . Diabetes Paternal Grandmother   . High Cholesterol Paternal Grandmother   . Stroke Paternal Grandmother   . Heart attack Paternal Grandfather   . High Cholesterol Paternal Grandfather      Medications: Current Outpatient Medications  Medication Sig Dispense Refill  . Calcium Carbonate-Vitamin D 600-400 MG-UNIT tablet Take 1 tablet by mouth 2 (two) times daily. 60 tablet 11  . ibuprofen (ADVIL,MOTRIN) 800 MG tablet Take 800 mg by mouth every 8 (eight) hours as needed.      . valACYclovir (VALTREX) 500 MG tablet Take 500 mg by mouth 2 (two) times daily.       No current facility-administered medications for this visit.     Allergies:  No Known Allergies   Review of Systems: General:   No F/C, wt loss Pulm:   No DIB, SOB, pleuritic chest pain Card:  No CP, palpitations Abd:  No n/v/d or pain Ext:  No inc edema from baseline  Objective:  Blood pressure 111/78, pulse 75, height 5\' 6"  (1.676 m), weight 127 lb (57.6 kg), last menstrual period  07/05/2018, SpO2 98 %. Body mass index is 20.5 kg/m. Gen:   Well NAD, A and O *3 HEENT:    [place]/AT, EOMI,  MMM Lungs:   Normal work of breathing. CTA B/L, no Wh, rhonchi Heart:   RRR, S1, S2 WNL's, no MRG Abd:   No gross distention Exts:    warm, pink,  Brisk capillary refill, warm and well perfused.  Psych:    No HI/SI, judgement and insight good, Euthymic mood. Full Affect.   Recent Results (from the past 2160 hour(s))  CBC with Differential/Platelet     Status: None   Collection Time: 07/15/18  8:54 AM  Result Value Ref Range   WBC 7.1 3.4 - 10.8 x10E3/uL   RBC 4.90 3.77 - 5.28 x10E6/uL   Hemoglobin 15.1 11.1 - 15.9 g/dL   Hematocrit 44.1 34.0 - 46.6 %   MCV 90 79 - 97 fL   MCH 30.8 26.6 - 33.0 pg   MCHC 34.2 31.5 - 35.7 g/dL   RDW 13.0 12.3 -  15.4 %   Platelets 190 150 - 450 x10E3/uL   Neutrophils 60 Not Estab. %   Lymphs 25 Not Estab. %   Monocytes 9 Not Estab. %   Eos 5 Not Estab. %   Basos 1 Not Estab. %   Neutrophils Absolute 4.3 1.4 - 7.0 x10E3/uL   Lymphocytes Absolute 1.8 0.7 - 3.1 x10E3/uL   Monocytes Absolute 0.7 0.1 - 0.9 x10E3/uL   EOS (ABSOLUTE) 0.3 0.0 - 0.4 x10E3/uL   Basophils Absolute 0.0 0.0 - 0.2 x10E3/uL   Immature Granulocytes 0 Not Estab. %   Immature Grans (Abs) 0.0 0.0 - 0.1 x10E3/uL  Comprehensive metabolic panel     Status: Abnormal   Collection Time: 07/15/18  8:54 AM  Result Value Ref Range   Glucose 91 65 - 99 mg/dL   BUN 10 6 - 20 mg/dL   Creatinine, Ser 0.80 0.57 - 1.00 mg/dL   GFR calc non Af Amer 96 >59 mL/min/1.73   GFR calc Af Amer 111 >59 mL/min/1.73   BUN/Creatinine Ratio 13 9 - 23   Sodium 142 134 - 144 mmol/L   Potassium 4.3 3.5 - 5.2 mmol/L   Chloride 104 96 - 106 mmol/L   CO2 22 20 - 29 mmol/L   Calcium 9.0 8.7 - 10.2 mg/dL   Total Protein 6.2 6.0 - 8.5 g/dL   Albumin 4.3 3.5 - 5.5 g/dL   Globulin, Total 1.9 1.5 - 4.5 g/dL   Albumin/Globulin Ratio 2.3 (H) 1.2 - 2.2   Bilirubin Total 0.8 0.0 - 1.2 mg/dL   Alkaline  Phosphatase 58 39 - 117 IU/L   AST 20 0 - 40 IU/L   ALT 11 0 - 32 IU/L  VITAMIN D 25 Hydroxy (Vit-D Deficiency, Fractures)     Status: None   Collection Time: 07/15/18  8:54 AM  Result Value Ref Range   Vit D, 25-Hydroxy 36.9 30.0 - 100.0 ng/mL    Comment: Vitamin D deficiency has been defined by the Institute of Medicine and an Endocrine Society practice guideline as a level of serum 25-OH vitamin D less than 20 ng/mL (1,2). The Endocrine Society went on to further define vitamin D insufficiency as a level between 21 and 29 ng/mL (2). 1. IOM (Institute of Medicine). 2010. Dietary reference    intakes for calcium and D. [place]: The    [employer]. 2. Holick MF, Binkley Whigham, Bischoff-Ferrari HA, et al.    Evaluation, treatment, and prevention of vitamin D    deficiency: an Endocrine Society clinical practice    guideline. JCEM. 2011 Jul; 96(7):1911-30.   TSH     Status: None   Collection Time: 07/15/18  8:54 AM  Result Value Ref Range   TSH 1.740 0.450 - 4.500 uIU/mL  Hemoglobin A1c     Status: None   Collection Time: 07/15/18  8:54 AM  Result Value Ref Range   Hgb A1c MFr Bld 5.2 4.8 - 5.6 %    Comment:          Prediabetes: 5.7 - 6.4          Diabetes: >6.4          Glycemic control for adults with diabetes: <7.0    Est. average glucose Bld gHb Est-mCnc 103 mg/dL  Lipid panel     Status: None   Collection Time: 07/15/18  8:54 AM  Result Value Ref Range   Cholesterol, Total 139 100 - 199 mg/dL   Triglycerides 114 0 -  149 mg/dL   HDL 53 >39 mg/dL   VLDL Cholesterol Cal 23 5 - 40 mg/dL   LDL Calculated 63 0 - 99 mg/dL   Chol/HDL Ratio 2.6 0.0 - 4.4 ratio    Comment:                                   T. Chol/HDL Ratio                                             Men  Women                               1/2 Avg.Risk  3.4    3.3                                   Avg.Risk  5.0    4.4                                2X Avg.Risk  9.6    7.1                                 3X Avg.Risk 23.4   11.0   T4, free     Status: None   Collection Time: 07/15/18  8:54 AM  Result Value Ref Range   Free T4 1.16 0.82 - 1.77 ng/dL

## 2018-07-21 NOTE — Patient Instructions (Addendum)
Please obtain a picture of yourself before you leave today - this is very important for privacy issues and HIPPA compliance!!   Please look at the CADR rating of different air purifiers.  Here is a valuable website:  Http://www.[employer]Basics.com.au  To find out more information about this    How to Increase Your Level of Physical Activity  Getting regular physical activity is important for your overall health and well-being. Most people do not get enough exercise. There are easy ways to increase your level of physical activity, even if you have not been very active in the past or you are just starting out. Why is physical activity important? Physical activity has many short-term and long-term health benefits. Regular exercise can:  Help you lose weight or maintain a healthy weight.  Strengthen your muscles and bones.  Boost your mood and improve self-esteem.  Reduce your risk of certain long-term (chronic) diseases, like heart disease, cancer, and diabetes.  Help you stay capable of walking and moving around (mobile) as you age.  Prevent accidents, such as falls, as you age.  Increase life expectancy.  What are the benefits of being physically active on a regular basis? In addition to improving your physical health, being physically active on most days of the week can help you in ways that you may not expect. Benefits of regular physical activity may include:  Feeling good about your body.  Being able to move around more easily and for longer periods of time without getting tired (increased stamina).  Finding new sources of fun and enjoyment.  Meeting new people who share a common interest.  Being able to fight off illness better (enhanced immunity).  Being able to sleep better.  What can happen if I am not physically active on a regular basis? Not getting enough physical activity can lead to an unhealthy lifestyle and future health problems. This can increase your chances  of:  Becoming overweight or obese.  Becoming sick.  Developing chronic illnesses, like heart disease or diabetes.  Having mental health problems, like depression or anxiety.  Having sleep problems.  Having trouble walking or getting yourself around (reduced mobility).  Injuring yourself in a fall as you get older.  What steps can I take to be more physically active?  Check with your health care provider about how to get started. Ask your health care provider what activities are safe for you.  Start out slowly. Walking or doing some simple chair exercises is a good place to start, especially if you have not been active before or for a long time.  Try to find activities that you enjoy. You are more likely to commit to an exercise routine if it does not feel like a chore.  If you have bone or joint problems, choose low-impact exercises, like walking or swimming.  Include physical activity in your everyday routine.  Invite friends or family members to exercise with you. This also will help you commit to your workout plan.  Set goals that you can work toward.  Aim for at least 150 minutes of moderate-intensity exercise each week. Examples of moderate-intensity exercise include walking or riding a bike. Where to find more information:  Centers for Disease Control and Prevention: [employer]lingG[employer].is  [employer] on [employer] & Nutrition www.http://villegas.org/  ChooseMyPlate: [employer].dk Contact a health care provider if:  You have headaches, muscle aches, or joint pain.  You feel dizzy or light-headed while exercising.  You faint.  You have chest pain while  exercising. Summary  Exercise benefits your mind and body at any age, even if you are just starting out.  If you have a chronic illness or have not been active for a while, check with your health care provider before increasing your physical  activity.  Choose activities that are safe and enjoyable for you.Ask your health care provider what activities are safe for you.  Start slowly. Tell your health care provider if you have problems as you start to increase your activity level. This information is not intended to replace advice given to you by your health care provider. Make sure you discuss any questions you have with your health care provider. Document Released: 10/16/2016 Document Revised: 10/16/2016 Document Reviewed: 10/16/2016 Elsevier Interactive Patient Education  [employer].   Nine ways to increase your "good" HDL cholesterol  High-density lipoprotein (HDL) is often referred to as the "good" cholesterol. Having high HDL levels helps carry cholesterol from your arteries to your liver, where it can be used or excreted.  Having high levels of HDL also has antioxidant and anti-inflammatory effects, and is linked to a reduced risk of heart disease (1, 2).  Most health experts recommend minimum blood levels of 40 mg/dl in men and 50 mg/dl in women.  While genetics definitely play a role, there are several other factors that affect HDL levels.  Here are nine healthy ways to raise your "good" HDL cholesterol.  1. Consume olive oil  two pieces of salmon on a plate olive oil being poured into a small dish Extra virgin olive oil may be more healthful than processed olive oils. Olive oil is one of the healthiest fats around.  A large analysis of 42 studies with more than 800,000 participants found that olive oil was the only source of monounsaturated fat that seemed to reduce heart disease risk (3).  Research has shown that one of olive oil's heart-healthy effects is an increase in HDL cholesterol. This effect is thought to be caused by antioxidants it contains called polyphenols (4, 5, 6, 7).  Extra virgin olive oil has more polyphenols than more processed olive oils, although the amount can still vary among  different types and brands.  One study gave 200 healthy young men about 2 tablespoons (25 ml) of different olive oils per day for three weeks.  The researchers found that participants' HDL levels increased significantly more after they consumed the olive oil with the highest polyphenol content (6).  In another study, when 26 older adults consumed about 4 tablespoons (50 ml) of high-polyphenol extra virgin olive oil every day for six weeks, their HDL cholesterol increased by 6.5 mg/dl, on average (7).  In addition to raising HDL levels, olive oil has been found to boost HDL's anti-inflammatory and antioxidant function in studies of older people and individuals with high cholesterol levels ( 7, 8, 9).  Whenever possible, select high-quality, certified extra virgin olive oils, which tend to be highest in polyphenols.  Bottom line: Extra virgin olive oil with a high polyphenol content has been shown to increase HDL levels in healthy people, the elderly and individuals with high cholesterol.  2. Follow a low-carb or ketogenic diet  Low-carb and ketogenic diets provide a number of health benefits, including weight loss and reduced blood sugar levels.  They have also been shown to increase HDL cholesterol in people who tend to have lower levels.  This includes those who are obese, insulin resistant or diabetic (10, 11, 12, 13, 14, 15, 16, 17).  In one study, people with type 2 diabetes were split into two groups.  One followed a diet consuming less than 50 grams of carbs per day. The other followed a high-carb diet.  Although both groups lost weight, the low-carb group's HDL cholesterol increased almost twice as much as the high-carb group's did (14).  In another study, obese people who followed a low-carb diet experienced an increase in HDL cholesterol of 5 mg/dl overall.  Meanwhile, in the same study, the participants who ate a low-fat, high-carb diet showed a decrease in HDL cholesterol  (15).  This response may partially be due to the higher levels of fat people typically consume on low-carb diets.  One study in overweight women found that diets high in meat and cheese increased HDL levels by 5-8%, compared to a higher-carb diet (18).  What's more, in addition to raising HDL cholesterol, very-low-carb diets have been shown to decrease triglycerides and improve several other risk factors for heart disease (13, 14, 16, 17).  Bottom line: Low-carb and ketogenic diets typically increase HDL cholesterol levels in people with diabetes, metabolic syndrome and obesity.  3. Exercise regularly  Being physically active is important for heart health.  Studies have shown that many different types of exercise are effective at raising HDL cholesterol, including strength training, high-intensity exercise and aerobic exercise (19, 20, 21, 22, 23, 24).  However, the biggest increases in HDL are typically seen with high-intensity exercise.  One small study followed women who were living with polycystic ovary syndrome (PCOS), which is linked to a higher risk of insulin resistance. The study required them to perform high-intensity exercise three times a week.  The exercise led to an increase in HDL cholesterol of 8 mg/dL after 10 weeks. The women also showed improvements in other health markers, including decreased insulin resistance and improved arterial function (23).  In a 12-week study, overweight men who performed high-intensity exercise experienced a 10% increase in HDL cholesterol.  In contrast, the low-intensity exercise group showed only a 2% increase and the endurance training group experienced no change (24).  However, even lower-intensity exercise seems to increase HDL's anti-inflammatory and antioxidant capacities, whether or not HDL levels change (20, 21, 25).  Overall, high-intensity exercise such as high-intensity interval training (HIIT) and high-intensity circuit training  (HICT) may boost HDL cholesterol levels the most.  Bottom line: Exercising several times per week can help raise HDL cholesterol and enhance its anti-inflammatory and antioxidant effects. High-intensity forms of exercise may be especially effective.  4. Add coconut oil to your diet  Studies have shown that coconut oil may reduce appetite, increase metabolic rate and help protect brain health, among other benefits.  Some people may be concerned about coconut oil's effects on heart health due to its high saturated fat content.  However, it appears that coconut oil is actually quite heart healthy.  Coconut oil tends to raise HDL cholesterol more than many other types of fat.  In addition, it may improve the ratio of low-density-lipoprotein (LDL) cholesterol, the "bad" cholesterol, to HDL cholesterol. Improving this ratio reduces heart disease risk (26, 27, 28, 29).  One study examined the health effects of coconut oil on 40 women with excess belly fat. The researchers found that participants who took coconut oil daily experienced increased HDL cholesterol and a lower LDL-to-HDL ratio.  In contrast, the group who took soybean oil daily had a decrease in HDL cholesterol and an increase in the LDL-to-HDL ratio (29).  Most studies have found  these health benefits occur at a dosage of about 2 tablespoons (30 ml) of coconut oil per day. It's best to incorporate this into cooking rather than eating spoonfuls of coconut oil on their own.  Bottom line: Consuming 2 tablespoons (30 ml) of coconut oil per day may help increase HDL cholesterol levels.  5. Stop smoking  cigarette butt Quitting smoking can reduce the risk of heart disease and lung cancer. Smoking increases the risk of many health problems, including heart disease and lung cancer (30).  One of its negative effects is a suppression of HDL cholesterol.  Some studies have found that quitting smoking can increase HDL levels. Indeed, one  study found no significant differences in HDL levels between former smokers and people who had never smoked (31, 32, 33, 34, 35).  In a one-year study of more than 1,500 people, those who quit smoking had twice the increase in HDL as those who resumed smoking within the year. The number of large HDL particles also increased, which further reduced heart disease risk (32).  One study followed smokers who switched from traditional cigarettes to electronic cigarettes for one year. They found that the switch was associated with an increase in HDL cholesterol of 5 mg/dl, on average (33).  When it comes to the effect of nicotine replacement patches on HDL levels, research results have been mixed.  One study found that nicotine replacement therapy led to higher HDL cholesterol. However, other research suggests that people who use nicotine patches likely won't see increases in HDL levels until after replacement therapy is completed (34, 36).  Even in studies where HDL cholesterol levels didn't increase after people quit smoking, HDL function improved, resulting in less inflammation and other beneficial effects on heart health (37).  Bottom line: Quitting smoking can increase HDL levels, improve HDL function and help protect heart health.  6. Lose weight  When overweight and obese people lose weight, their HDL cholesterol levels usually increase.  What's more, this benefit seems to occur whether weight loss is achieved by calorie counting, carb restriction, intermittent fasting, weight loss surgery or a combination of diet and exercise (16, 38, 39, 40, 41, 42).  One study examined HDL levels in more than 3,000 overweight and obese [place] adults who followed a lifestyle modification program for one year.  The researchers found that losing at least 6.6 lbs (3 kg) led to an increase in HDL cholesterol of 4 mg/dl, on average (41).  In another study, when obese people with type 2 diabetes consumed  calorie-restricted diets that provided 20-30% of calories from protein, they experienced significant increases in HDL cholesterol levels (42).  The key to achieving and maintaining healthy HDL cholesterol levels is choosing the type of diet that makes it easiest for you to lose weight and keep it off.  Bottom Line: Several methods of weight loss have been shown to increase HDL cholesterol levels in people who are overweight or obese.  7. Choose purple produce  Consuming purple-colored fruits and vegetables is a delicious way to potentially increase HDL cholesterol.  Purple produce contains antioxidants known as anthocyanins.  Studies using anthocyanin extracts have shown that they help fight inflammation, protect your cells from damaging free radicals and may also raise HDL cholesterol levels (43, 44, 45, 46).  In a 24-week study of 21 people with diabetes, those who took an anthocyanin supplement twice a day experienced a 19% increase in HDL cholesterol, on average, along with other improvements in heart health markers (45).  In another study, when people with cholesterol issues took anthocyanin extract for 12 weeks, their HDL cholesterol levels increased by 13.7% (46).  Although these studies used extracts instead of foods, there are several fruits and vegetables that are very high in anthocyanins. These include eggplant, purple corn, red cabbage, blueberries, blackberries and black raspberries.  Bottom line: Consuming fruits and vegetables rich in anthocyanins may help increase HDL cholesterol levels.  8. Eat fatty fish often  The omega-3 fats in fatty fish provide major benefits to heart health, including a reduction in inflammation and better functioning of the cells that line your arteries (47, 48).  There's some research showing that eating fatty fish or taking fish oil may also help raise low levels of HDL cholesterol (49, 50, 51, 52, 53).  In a study of 33 heart disease patients,  participants that consumed fatty fish four times per week experienced an increase in HDL cholesterol levels. The particle size of their HDL also increased (52).  In another study, overweight men who consumed herring five days a week for six weeks had a 5% increase in HDL cholesterol, compared with their levels after eating lean pork and chicken five days a week (53).  However, there are a few studies that found no increase in HDL cholesterol in response to increased fish or omega-3 supplement intake (54, 55).  In addition to herring, other types of fatty fish that may help raise HDL cholesterol include salmon, sardines, mackerel and anchovies.  Bottom line: Eating fatty fish several times per week may help increase HDL cholesterol levels and provide other benefits to heart health.  9. Avoid artificial trans fats  Artificial trans fats have many negative health effects due to their inflammatory properties (56, 57).  There are two types of trans fats. One kind occurs naturally in animal products, including full-fat dairy.  In contrast, the artificial trans fats found in margarines and processed foods are created by adding hydrogen to unsaturated vegetable and seed oils. These fats are also known as industrial trans fats or partially hydrogenated fats.  Research has shown that, in addition to increasing inflammation and contributing to several health problems, these artificial trans fats may lower HDL cholesterol levels.  In one study, researchers compared how people's HDL levels responded when they consumed different margarines.  The study found that participants' HDL cholesterol levels were 10% lower after consuming margarine containing partially hydrogenated soybean oil, compared to their levels after consuming palm oil (58).  Another controlled study followed 40 adults who had diets high in different types of trans fats.  They found that HDL cholesterol levels in women were significantly  lower after they consumed the diet high in industrial trans fats, compared to the diet containing naturally occurring trans fats (59).  To protect heart health and keep HDL cholesterol in the healthy range, it's best to avoid artificial trans fats altogether.  Bottom line: Artificial trans fats have been shown to lower HDL levels and increase inflammation, compared to other fats.  Take home message  Although your HDL cholesterol levels are partly determined by your genetics, there are many things you can do to naturally increase your own levels.  Fortunately, the practices that raise HDL cholesterol often provide other health benefits as well.

## 2018-10-23 DIAGNOSIS — R300 Dysuria: Secondary | ICD-10-CM | POA: Diagnosis not present

## 2018-10-25 ENCOUNTER — Other Ambulatory Visit: Payer: Self-pay | Admitting: Family Medicine

## 2018-10-25 NOTE — Telephone Encounter (Signed)
Request for Ibuprofen 800 mg.  Medication was last filled by a pervious provider.  Please review and advise. LOV 07/21/18 MPulliam, CMA/RT(R)

## 2019-01-17 ENCOUNTER — Other Ambulatory Visit: Payer: 59

## 2019-01-17 ENCOUNTER — Ambulatory Visit: Payer: 59 | Admitting: Family Medicine

## 2019-01-19 ENCOUNTER — Ambulatory Visit: Payer: 59 | Admitting: Family Medicine

## 2019-04-20 ENCOUNTER — Other Ambulatory Visit: Payer: Self-pay | Admitting: Family Medicine

## 2019-04-21 ENCOUNTER — Other Ambulatory Visit: Payer: Self-pay | Admitting: Family Medicine

## 2019-04-21 NOTE — Telephone Encounter (Signed)
LOV 07/22/2019.  [facility] last filled by pervious provider please review and advise. MPulliam, CMA/RT(R)

## 2019-06-21 ENCOUNTER — Encounter: Payer: Self-pay | Admitting: Family Medicine

## 2019-06-21 ENCOUNTER — Other Ambulatory Visit (HOSPITAL_COMMUNITY)
Admission: RE | Admit: 2019-06-21 | Discharge: 2019-06-21 | Disposition: A | Discharge status: Home / Self Care | Payer: 59 | Source: Ambulatory Visit | Attending: Family Medicine | Admitting: Family Medicine

## 2019-06-21 ENCOUNTER — Ambulatory Visit (INDEPENDENT_AMBULATORY_CARE_PROVIDER_SITE_OTHER): Payer: 59 | Admitting: Family Medicine

## 2019-06-21 ENCOUNTER — Other Ambulatory Visit: Payer: Self-pay

## 2019-06-21 ENCOUNTER — Telehealth: Payer: Self-pay | Admitting: Family Medicine

## 2019-06-21 VITALS — BP 109/76 | HR 72 | Temp 98.4°F | Ht 67.0 in | Wt 137.8 lb

## 2019-06-21 DIAGNOSIS — Z803 Family history of malignant neoplasm of breast: Secondary | ICD-10-CM | POA: Diagnosis not present

## 2019-06-21 DIAGNOSIS — Z0000 Encounter for general adult medical examination without abnormal findings: Secondary | ICD-10-CM

## 2019-06-21 DIAGNOSIS — Z124 Encounter for screening for malignant neoplasm of cervix: Secondary | ICD-10-CM | POA: Diagnosis not present

## 2019-06-21 DIAGNOSIS — Z1283 Encounter for screening for malignant neoplasm of skin: Secondary | ICD-10-CM

## 2019-06-21 DIAGNOSIS — F17209 Nicotine dependence, unspecified, with unspecified nicotine-induced disorders: Secondary | ICD-10-CM

## 2019-06-21 DIAGNOSIS — Z23 Encounter for immunization: Secondary | ICD-10-CM

## 2019-06-21 DIAGNOSIS — E559 Vitamin D deficiency, unspecified: Secondary | ICD-10-CM

## 2019-06-21 NOTE — Progress Notes (Signed)
Impression and Recommendations:    1. Vitamin D deficiency   2. Vitamin D insufficiency   3. Healthcare maintenance   4. Family history of breast cancer   5. Tobacco use disorder, continuous   6. Screening exam for skin cancer      1) Anticipatory Guidance: Discussed importance of wearing a seatbelt while driving, not texting while driving; sunscreen when outside along with yearly skin surveillance; eating a well balanced and modest diet; physical activity at least 25 minutes per day or 150 min/ week of moderate to intense activity.  - To clear the ears, advised patient to prudently use half hydrogen peroxide, half rubbing alcohol.  - Advised patient to keep up to date with prudent skin screenings.  - Discussed importance of prudent self-breast exam screening habits.  Discussed and demonstrated during appointment today.  2) Immunizations / Screenings / Labs:  All immunizations and screenings that patient agrees to, are up-to-date per recommendations or will be updated today.  Patient understands the needs for q 71mo dental and yearly vision screens which pt will schedule independently. Obtain CBC, CMP, HgA1c, Lipid panel, TSH and vit D when fasting if not already done recently.   - Need for lab work.  - Need for HIV, Hepatitis C screening.  Patient strongly declines HIV/Hep C screen today.  - Need for pap smear. - Pap smear last done in 7 of 2016 through [facility]. - Recommended pap smear today.  Patient agrees to have it done.  - Need for TDAP.  - Extensive education provided to patient today regarding recommendations for mammogram in the future.  - Patient requests dermatology referral for yearly skin cancer screenings.  3) Tobacco Use Disorder & Smoking Cessation Told pt to think seriously about quitting smoking!  Told pt it is very important for his/her health and well being.    Smoking cessation instruction/counseling given:  counseled patient on the dangers of  tobacco use, advised patient to stop smoking, and reviewed strategies to maximize success  Discussed with patient that there are multiple treatments to aid in quitting smoking, however I explained none will work unless pt really want to quit  Told to call 1-800-QUIT-NOW 2342044420) for free smoking cessation counseling and support, or pt can go online to www.heart.org - the American Heart Association website and search "quit smoking ".   4) Weight - BMI 21.58 Encouraged patient to improve nutrient density of diet through increasing intake of fruits and vegetables and decreasing saturated/trans fats, white flour products and refined sugar products.    No orders of the defined types were placed in this encounter.   Orders Placed This Encounter  Procedures  . CBC with Differential/Platelet  . Comprehensive metabolic panel  . Hemoglobin A1c  . Lipid panel  . T4, free  . TSH  . VITAMIN D 25 Hydroxy (Vit-D Deficiency, Fractures)  . Ambulatory referral to Dermatology    Gross side effects, risk and benefits, and alternatives of medications discussed with patient.  Patient is aware that all medications have potential side effects and we are unable to predict every side effect or drug-drug interaction that may occur.  Expresses verbal understanding and consents to current therapy plan and treatment regimen.  F-up preventative CPE in 1 year. F/up sooner for chronic care management as discussed and/or prn.  Please see orders placed and AVS handed out to patient at the end of our visit for further patient instructions/ counseling done pertaining to today's office visit.  This document serves as a record of services personally performed by Mellody Dance, DO. It was created on her behalf by Toni Amend, a trained medical scribe. The creation of this record is based on the scribe's personal observations and the provider's statements to them.   I have reviewed the above medical  documentation for accuracy and completeness and I concur.  Mellody Dance, DO 06/22/2019 1:16 PM        Subjective:    Chief Complaint  Patient presents with  . Annual Exam   CC:   HPI: Jakeria MACARENA LANGSETH is a 33 y.o. female who presents to [facility] at [facility] today a yearly health maintenance exam.  Health Maintenance Summary Reviewed and updated, unless pt declines services.  Tobacco History Reviewed:  Current tobacco user; every day smoker.  Started smoking in high school, around 2000; has never smoked a pack a day "unless it's going all out and being crazy."  Says she's smoked half a pack regularly, 0.5 ppd, 3.75 pack-years. Alcohol:  No concerns, no excessive use reported. Exercise Habits:  Not meeting AHA guidelines. STD concerns:  None reported, states monogamous with partner for 7 years. Drug Use:  None reported. Birth control method:  Condoms/ rhythm method Menses regular:  Yes, no concerns. Lumps or breast concerns:  None reported. Breast Cancer Family History:  No first-degree relatives with breast cancer, however, maternal grandmother had bilateral mastectomy for breast cancer; one of the daughters (maternal aunt) had to have a single mastectomy for breast cancer.  Patient has fibrocystic breast changes bilaterally.  Patient does not obtain mammograms.  Says she's been doing well.  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  Female Health Patient has not returned to [place] since July of 2016.  She has a history of abnormal pap 10+ years ago; has had "multiple normal since, but has been known to have several abnormals as well."  Again states she has had several normal pap smears since.  She has also had HPV positive pap smears.  States her cervix is  in a "weird location" and "not easy to see."  Denies rash or concerns around genital area.  Eye Health Last eye exam was about a year ago, states her glasses are "not quite a year old."  [place] going to the dentist every six months.     There is no immunization history on file for this patient.  Health Maintenance  Topic Date Due  . HIV Screening  01/24/1999  . TETANUS/TDAP  01/24/2003  . PAP SMEAR-Modifier  06/06/2018  . INFLUENZA VACCINE  06/11/2019     Wt Readings from Last 3 Encounters:  06/21/19 137 lb 12.8 oz (62.5 kg)  07/21/18 127 lb (57.6 kg)  06/07/18 130 lb 14.4 oz (59.4 kg)   BP Readings from Last 3 Encounters:  06/21/19 109/76  07/21/18 111/78  06/07/18 119/76   Pulse Readings from Last 3 Encounters:  06/21/19 72  07/21/18 75  06/07/18 68     Past Medical History:  Diagnosis Date  . HSV infection       Past Surgical History:  Procedure Laterality Date  . LEEP    . WISDOM TOOTH EXTRACTION        Family History  Problem Relation Age of  Onset  . Hypertension Mother   . Hyperlipidemia Mother   . Alcoholism Mother   . Hyperlipidemia Father   . Hypertension Father   . Cancer Other   . Alcoholism Sister   . High Cholesterol Maternal Grandmother   . Breast cancer Maternal Grandmother   . Diabetes Paternal Grandmother   . High Cholesterol Paternal Grandmother   . Stroke Paternal Grandmother   . Heart attack Paternal Grandfather   . High Cholesterol Paternal Grandfather       Social History   Substance and Sexual Activity  Drug Use No  ,   Social History   Substance and Sexual Activity  Alcohol Use Yes  . Alcohol/week: 10.0 - 15.0 standard drinks  . Types: 10 - 15 Standard drinks or equivalent per week  ,   Social History   Tobacco Use  Smoking Status Current Every Day Smoker  . Packs/day: 0.25  . Years: 15.00  . Pack years: 3.75  . Types: Cigarettes  Smokeless Tobacco Never Used  ,   Social  History   Substance and Sexual Activity  Sexual Activity Yes  . Birth control/protection: None    Current Outpatient Medications on File Prior to Visit  Medication Sig Dispense Refill  . ibuprofen (ADVIL) 800 MG tablet Take 1 tablet (800 mg total) by mouth 3 (three) times daily. Office visit needed for refills 30 tablet 0  . valACYclovir (VALTREX) 500 MG tablet Take 500 mg by mouth 2 (two) times daily.       No current facility-administered medications on file prior to visit.     Allergies: Patient has no known allergies.  Review of Systems: General:   Denies fever, chills, unexplained weight loss.  Optho/Auditory:   Denies visual changes, blurred vision/LOV Respiratory:   Denies SOB, DOE more than baseline levels.  Cardiovascular:   Denies chest pain, palpitations, new onset peripheral edema  Gastrointestinal:   Denies nausea, vomiting, diarrhea.  Genitourinary: Denies dysuria, freq/ urgency, flank pain or discharge from genitals.  Endocrine:     Denies hot or cold intolerance, polyuria, polydipsia. Musculoskeletal:   Denies unexplained myalgias, joint swelling, unexplained arthralgias, gait problems.  Skin:  Denies rash, suspicious lesions Neurological:     Denies dizziness, unexplained weakness, numbness  Psychiatric/Behavioral:   Denies mood changes, suicidal or homicidal ideations, hallucinations    Objective:    Blood pressure 109/76, pulse 72, temperature 98.4 F (36.9 C), height 5\' 7"  (1.702 m), weight 137 lb 12.8 oz (62.5 kg), last menstrual period 06/19/2019, SpO2 97 %. Body mass index is 21.58 kg/m. General Appearance:   Tattoo on the back and left upper arm.  Alert, cooperative, no distress, appears stated age  Head:    Normocephalic, without obvious abnormality, atraumatic  Eyes:    PERRL, conjunctiva/corneas clear, EOM's intact, fundi    benign, both eyes  Ears:    Normal TM's and external ear canals, both ears  Nose:   Nares normal, septum midline, mucosa  normal, no drainage    or sinus tenderness  Throat:   Lips w/o lesion, mucosa moist, and tongue normal; teeth and   gums normal  Neck:   Supple, symmetrical, trachea midline, no adenopathy;    thyroid:  no enlargement/tenderness/nodules; no carotid   bruit or JVD  Back:     Symmetric, no curvature, ROM normal, no CVA tenderness  Lungs:     Clear to auscultation bilaterally, respirations unlabored, no       Wh/ R/ R  Chest  Wall:    No tenderness or gross deformity; normal excursion   Heart:    Regular rate and rhythm, S1 and S2 normal, no murmur, rub   or gallop  Breast Exam:    No tenderness, masses, or nipple abnormality b/l; no d/c  Abdomen:    Umbilical piercing. Soft, non-tender, bowel sounds active all four quadrants, NO   G/R/R, no masses, no organomegaly  Genitalia:    Ext genitalia: without lesion, no rash or discharge, No         Tenderness; copious amounts of heme in vaginal vault and around cervical os.  Cervix: WNL's w/o discharge or lesion;        Adnexa:  No tenderness or palpable masses..  Rectal:    Deferred.  Extremities:   Extremities normal, atraumatic, no cyanosis or gross edema  Pulses:   2+ and symmetric all extremities  Skin:   Warm, dry, Skin color, texture, turgor normal, no obvious rashes or lesions Psych: No HI/SI, judgement and insight good, Euthymic mood. Full Affect.  Neurologic:   CNII-XII intact, normal strength, sensation and reflexes    Throughout

## 2019-06-21 NOTE — Telephone Encounter (Signed)
Patient was here this morning and mentioned needing refills of both the ibuprofen and Valtrex but she states her pharm has not received anything. If approved please send to [facility] in [place] on [address].

## 2019-06-21 NOTE — Telephone Encounter (Signed)
Waiting on Dr. Raliegh Scarlet to approve refills as one medication was last filled by a pervious provider.  Message has been sent to Dr. Raliegh Scarlet to review. MPulliam, CMA/RT(R)

## 2019-06-21 NOTE — Patient Instructions (Addendum)
Please think seriously about quitting smoking!  This is very important for your health and well being.    Please let us know in the future if you are interested and ready to quit.   You can also call 1-800-QUIT-NOW 916-112-8592) for free smoking cessation counseling and support.     Also, please go online to www.heart.org (the American Heart Association website) and search "quit smoking ".    Or try the centers for disease control website at: https://www.schmidt.com/  Or, go to the "national cancer institute" web site of [employer]:  http://benson.com/  There is a lot of great information on these websites for you to look over.      Want to Quit Smoking? FDA-Approved Products Can Help  Quitting smoking can be hard, but it is possible. In fact, every time you put out a cigarette is a new chance to try quitting again, according to the U.S. Food and Drug Administration's newest tobacco education campaign, "Every Try Counts."   If you want to quit-almost 70 percent of adult smokers say they do-you may want to use a "smoking cessation" product proven to help. Data has shown that using FDA-approved cessation medicine can double your chance of quitting successfully.  Some products contain nicotine as an active ingredient and others do not. These products include over-the-counter (OTC) options like skin patches, lozenges, and gum, as well as prescription medicines.  Smoking cessation products are intended to help you quit smoking. They are regulated through the [facility], which ensures that the products are safe and effective and that their benefits outweigh any known associated risks.  The Benefits of Quitting Smoking No matter how much you smoke-or for how long-quitting will benefit you.  Not only will you lower your risk of getting various cancers, including lung cancer,  you'll also reduce your chances of having heart disease, a stroke, emphysema, and other serious diseases. Quitting also will lower the risk of heart disease and lung cancer in nonsmokers who otherwise would be exposed to your secondhand smoke.  Although there are benefits to quitting at any age, it is important to quit as soon as possible so your body can begin to heal from the damage caused by smoking. For instance, 12 hours after you quit smoking the carbon monoxide level in your blood drops to normal. Carbon monoxide is harmful because it displaces oxygen in the blood and deprives your heart, brain, and other vital organs of oxygen.  What To Know About Smoking Cessation Products Understanding how smoking cessation products work-and what side effects they may cause-can help you determine which product may be best for you.  If you're considering one of these products, reading labels and talking to your pharmacist and other health care providers are good first steps to take.  You also can check the FDA's website for more information on each product at Drugs_0 , where you can search for each product by name.  And remember to weigh each product's benefits and risks, among other considerations.  About Nicotine Replacement Therapy (NRT) Nicotine is the substance primarily responsible for causing addiction to tobacco products. Tobacco users who are addicted to nicotine are used to having nicotine in their bodies.  As you try to quit smoking, you may have symptoms of nicotine withdrawal. When you quit, this withdrawal may cause symptoms like cravings, or urges, to smoke; depression; trouble sleeping; irritability; anxiety; and increased appetite.  Nicotine withdrawal can discourage some smokers from continuing with a quit  attempt. But the FDA has approved several smoking cessation products designed to help users gradually withdraw from smoking (that is, "wean" themselves from smoking) by using specific  amounts of nicotine that decrease over time. This type of product is called a "nicotine replacement therapy" or NRT. It supplies nicotine in controlled amounts while sparing you from other chemicals found in tobacco products.  NRTs are available over the counter and by prescription. You should generally use them only for a short time to help you manage nicotine cravings and withdrawal. However, the FDA recognizes that some people may need to use these products longer to stay smoke-free. Talk to your health care provider to determine the best course of treatment for you.  Over-the-counter NRTs are approved for sale to people age 54 and older. They are available under various brand names and sometimes as generic products. They include:  - Skin patches (also called "transdermal nicotine patches"). These patches are placed on the skin, similar to how you would apply an adhesive bandage. - Chewing gum (also called "nicotine gum"). This gum must be chewed according to the labeled instructions to be effective. - Lozenges (also called "nicotine lozenges"). You use these products by dissolving them in your mouth. For over-the-counter products, it's important to follow the instructions on the Drug Facts Label (DFL) and to read the enclosed User's Guide for complete directions and other important information. Ask your health care provider if you have questions.  Currently, prescription nicotine replacement therapy is available only under the brand name Nicotrol, and is available both as a nasal spray and an oral inhaler. The products are FDA-approved only for use by adults.  If you are under age 33 and want to quit smoking, talk to a health care professional about whether you should use nicotine replacement therapies.  Important Advice for People Considering Nicotine Replacement Therapy Women who are pregnant or breastfeeding should talk to their health care providers and use nicotine replacement products only  if the health care providers approve.  Also talk to your health care provider before using these products if you have:  diabetes, heart disease, asthma, or stomach ulcers; had a recent heart attack; high blood pressure that is not controlled with medicine; a history of irregular heartbeat; or been prescribed medication to help you quit smoking. If you take prescription medication for depression or asthma, tell your health care provider if you are quitting smoking because he or she may need to change your prescription dose.  Stop using a nicotine replacement product and call your health care professional if you have any of the following symptoms: nausea; dizziness; weakness; vomiting; fast or irregular heartbeat; mouth problems with the lozenge or gum; or redness or swelling of the skin around the patch that does not go away.  About Prescription Cessation Medicines Without Nicotine  The FDA has approved two smoking cessation products that do not contain nicotine. They are Chantix (varenicline tartrate) and Zyban (buproprion hydrochloride). Both are available in tablet form and by prescription only.  Chantix acts at sites in the brain affected by nicotine by reducing the rewarding effects of nicotine. The precise way that Zyban helps with smoking cessation is unknown.  As with other prescription products, the FDA has evaluated these medicines and found that the benefits outweigh the risks. For users taking these products, risks include changes in behavior, depressed mood, hostility, aggression, and suicidal thoughts or actions.  The most common side effects of Chantix include nausea; constipation; gas; vomiting; and trouble  sleeping or vivid, unusual, or strange dreams. Chantix also may change how you react to alcohol, so talk to your health care provider about your drinking habits (if you drink alcohol) and whether these habits need to change. Chantix is not recommended for people under  the age of 65.  The most commonly observed side effects consistently associated with the use of Zyban are dry mouth and insomnia.  Because Zyban contains the same active ingredient as the antidepressant Wellbutrin (bupropion), the FDA encourages people who use Zyban-and those who are considering it-to talk to their health care providers about the risks of treatment with antidepressant medicines. Zyban has not been studied in children under the age of 66 and is not approved for use in children and teenagers.  Note: If your health care provider prescribes Chantix or Zyban, please read the product's patient medication guide in its entirety. These guides offer important information on side effects, risks, warnings, product ingredients, and what you should talk about with your health care provider before taking the products.  Finally, if you ever have any side effects related to any smoking cessation products, or have any other problems related to your treatment, the FDA would like to hear from you. Please consider making a voluntary and confidential report to the FDA's MedWatch program.  Updated: October 20, 2016       Preventive Care for Adults, Female  A healthy lifestyle and preventive care can promote health and wellness. Preventive health guidelines for women include the following key practices.   A routine yearly physical is a good way to check with your health care provider about your health and preventive screening. It is a chance to share any concerns and updates on your health and to receive a thorough exam.   Visit your dentist for a routine exam and preventive care every 6 months. Brush your teeth twice a day and floss once a day. Good oral hygiene prevents tooth decay and gum disease.   The frequency of eye exams is based on your age, health, family medical history, use of contact lenses, and other factors. Follow your health care provider's recommendations for frequency of eye  exams.   Eat a healthy diet. Foods like vegetables, fruits, whole grains, low-fat dairy products, and lean protein foods contain the nutrients you need without too many calories. Decrease your intake of foods high in solid fats, added sugars, and salt. Eat the right amount of calories for you.Get information about a proper diet from your health care provider, if necessary.   Regular physical exercise is one of the most important things you can do for your health. Most adults should get at least 150 minutes of moderate-intensity exercise (any activity that increases your heart rate and causes you to sweat) each week. In addition, most adults need muscle-strengthening exercises on 2 or more days a week.   Maintain a healthy weight. The body mass index (BMI) is a screening tool to identify possible weight problems. It provides an estimate of body fat based on height and weight. Your health care provider can find your BMI, and can help you achieve or maintain a healthy weight.For adults 20 years and older:   - A BMI below 18.5 is considered underweight.   - A BMI of 18.5 to 24.9 is normal.   - A BMI of 25 to 29.9 is considered overweight.   - A BMI of 30 and above is considered obese.   Maintain normal blood lipids and cholesterol levels  by exercising and minimizing your intake of trans and saturated fats.  Eat a balanced diet with plenty of fruit and vegetables. Blood tests for lipids and cholesterol should begin at age 2 and be repeated every 5 years minimum.  If your lipid or cholesterol levels are high, you are over 40, or you are at high risk for heart disease, you may need your cholesterol levels checked more frequently.Ongoing high lipid and cholesterol levels should be treated with medicines if diet and exercise are not working.   If you smoke, find out from your health care provider how to quit. If you do not use tobacco, do not start.   Lung cancer screening is recommended for adults  aged 58-80 years who are at high risk for developing lung cancer because of a history of smoking. A yearly low-dose CT scan of the lungs is recommended for people who have at least a 30-pack-year history of smoking and are a current smoker or have quit within the past 15 years. A pack year of smoking is smoking an average of 1 pack of cigarettes a day for 1 year (for example: 1 pack a day for 30 years or 2 packs a day for 15 years). Yearly screening should continue until the smoker has stopped smoking for at least 15 years. Yearly screening should be stopped for people who develop a health problem that would prevent them from having lung cancer treatment.   If you are pregnant, do not drink alcohol. If you are breastfeeding, be very cautious about drinking alcohol. If you are not pregnant and choose to drink alcohol, do not have more than 1 drink per day. One drink is considered to be 12 ounces (355 mL) of beer, 5 ounces (148 mL) of wine, or 1.5 ounces (44 mL) of liquor.   Avoid use of street drugs. Do not share needles with anyone. Ask for help if you need support or instructions about stopping the use of drugs.   High blood pressure causes heart disease and increases the risk of stroke. Your blood pressure should be checked at least yearly.  Ongoing high blood pressure should be treated with medicines if weight loss and exercise do not work.   If you are 20-81 years old, ask your health care provider if you should take aspirin to prevent strokes.   Diabetes screening involves taking a blood sample to check your fasting blood sugar level. This should be done once every 3 years, after age 4, if you are within normal weight and without risk factors for diabetes. Testing should be considered at a younger age or be carried out more frequently if you are overweight and have at least 1 risk factor for diabetes.   Breast cancer screening is essential preventive care for women. You should practice  "breast self-awareness."  This means understanding the normal appearance and feel of your breasts and may include breast self-examination.  Any changes detected, no matter how small, should be reported to a health care provider.  Women in their 46s and 30s should have a clinical breast exam (CBE) by a health care provider as part of a regular health exam every 1 to 3 years.  After age 6, women should have a CBE every year.  Starting at age 43, women should consider having a mammogram (breast X-ray test) every year.  Women who have a family history of breast cancer should talk to their health care provider about genetic screening.  Women at a high risk  of breast cancer should talk to their health care providers about having an MRI and a mammogram every year.   -Breast cancer gene (BRCA)-related cancer risk assessment is recommended for women who have family members with BRCA-related cancers. BRCA-related cancers include breast, ovarian, tubal, and peritoneal cancers. Having family members with these cancers may be associated with an increased risk for harmful changes (mutations) in the breast cancer genes BRCA1 and BRCA2. Results of the assessment will determine the need for genetic counseling and BRCA1 and BRCA2 testing.   The Pap test is a screening test for cervical cancer. A Pap test can show cell changes on the cervix that might become cervical cancer if left untreated. A Pap test is a procedure in which cells are obtained and examined from the lower end of the uterus (cervix).   - Women should have a Pap test starting at age 7.   - Between ages 65 and 76, Pap tests should be repeated every 2 years.   - Beginning at age 93, you should have a Pap test every 3 years as long as the past 3 Pap tests have been normal.   - Some women have medical problems that increase the chance of getting cervical cancer. Talk to your health care provider about these problems. It is especially important to talk to  your health care provider if a new problem develops soon after your last Pap test. In these cases, your health care provider may recommend more frequent screening and Pap tests.   - The above recommendations are the same for women who have or have not gotten the vaccine for human papillomavirus (HPV).   - If you had a hysterectomy for a problem that was not cancer or a condition that could lead to cancer, then you no longer need Pap tests. Even if you no longer need a Pap test, a regular exam is a good idea to make sure no other problems are starting.   - If you are between ages 47 and 89 years, and you have had normal Pap tests going back 10 years, you no longer need Pap tests. Even if you no longer need a Pap test, a regular exam is a good idea to make sure no other problems are starting.   - If you have had past treatment for cervical cancer or a condition that could lead to cancer, you need Pap tests and screening for cancer for at least 20 years after your treatment.   - If Pap tests have been discontinued, risk factors (such as a new sexual partner) need to be reassessed to determine if screening should be resumed.   - The HPV test is an additional test that may be used for cervical cancer screening. The HPV test looks for the virus that can cause the cell changes on the cervix. The cells collected during the Pap test can be tested for HPV. The HPV test could be used to screen women aged 16 years and older, and should be used in women of any age who have unclear Pap test results. After the age of 98, women should have HPV testing at the same frequency as a Pap test.   Colorectal cancer can be detected and often prevented. Most routine colorectal cancer screening begins at the age of 57 years and continues through age 16 years. However, your health care provider may recommend screening at an earlier age if you have risk factors for colon cancer. On a yearly basis, your health  care provider may  provide home test kits to check for hidden blood in the stool.  Use of a small camera at the end of a tube, to directly examine the colon (sigmoidoscopy or colonoscopy), can detect the earliest forms of colorectal cancer. Talk to your health care provider about this at age 21, when routine screening begins. Direct exam of the colon should be repeated every 5 -10 years through age 65 years, unless early forms of pre-cancerous polyps or small growths are found.   People who are at an increased risk for hepatitis B should be screened for this virus. You are considered at high risk for hepatitis B if:  -You were born in a country where hepatitis B occurs often. Talk with your health care provider about which countries are considered high risk.  - Your parents were born in a high-risk country and you have not received a shot to protect against hepatitis B (hepatitis B vaccine).  - You have HIV or AIDS.  - You use needles to inject street drugs.  - You live with, or have sex with, someone who has Hepatitis B.  - You get hemodialysis treatment.  - You take certain medicines for conditions like cancer, organ transplantation, and autoimmune conditions.   Hepatitis C blood testing is recommended for all people born from [birth year] through 1967 and any individual with known risks for hepatitis C.   Practice safe sex. Use condoms and avoid high-risk sexual practices to reduce the spread of sexually transmitted infections (STIs). STIs include gonorrhea, chlamydia, syphilis, trichomonas, herpes, HPV, and human immunodeficiency virus (HIV). Herpes, HIV, and HPV are viral illnesses that have no cure. They can result in disability, cancer, and death. Sexually active women aged 50 years and younger should be checked for chlamydia. Older women with new or multiple partners should also be tested for chlamydia. Testing for other STIs is recommended if you are sexually active and at increased risk.   Osteoporosis is a  disease in which the bones lose minerals and strength with aging. This can result in serious bone fractures or breaks. The risk of osteoporosis can be identified using a bone density scan. Women ages 33 years and over and women at risk for fractures or osteoporosis should discuss screening with their health care providers. Ask your health care provider whether you should take a calcium supplement or vitamin D to There are also several preventive steps women can take to avoid osteoporosis and resulting fractures or to keep osteoporosis from worsening. -->Recommendations include:  Eat a balanced diet high in fruits, vegetables, calcium, and vitamins.  Get enough calcium. The recommended total intake of is 1,200 mg daily; for best absorption, if taking supplements, divide doses into 250-500 mg doses throughout the day. Of the two types of calcium, calcium carbonate is best absorbed when taken with food but calcium citrate can be taken on an empty stomach.  Get enough vitamin D. [facility] and the [place] recommend at least 1,000 IU per day for women age 17 and over who are at risk of vitamin D deficiency. Vitamin D deficiency can be caused by inadequate sun exposure (for example, those who live in [place]).  Avoid alcohol and smoking. Heavy alcohol intake (more than 7 drinks per week) increases the risk of falls and hip fracture and women smokers tend to lose bone more rapidly and have lower bone mass than nonsmokers. Stopping smoking is one of the most important changes women can make to improve  their health and decrease risk for disease.  Be physically active every day. Weight-bearing exercise (for example, fast walking, hiking, jogging, and weight training) may strengthen bones or slow the rate of bone loss that comes with aging. Balancing and muscle-strengthening exercises can reduce the risk of falling and fracture.  Consider therapeutic medications. Currently, several  types of effective drugs are available. Healthcare providers can recommend the type most appropriate for each woman.  Eliminate environmental factors that may contribute to accidents. Falls cause nearly 90% of all osteoporotic fractures, so reducing this risk is an important bone-health strategy. Measures include ample lighting, removing obstructions to walking, using nonskid rugs on floors, and placing mats and/or grab bars in showers.  Be aware of medication side effects. Some common medicines make bones weaker. These include a type of steroid drug called glucocorticoids used for arthritis and asthma, some antiseizure drugs, certain sleeping pills, treatments for endometriosis, and some cancer drugs. An overactive thyroid gland or using too much thyroid hormone for an underactive thyroid can also be a problem. If you are taking these medicines, talk to your doctor about what you can do to help protect your bones.reduce the rate of osteoporosis.    Menopause can be associated with physical symptoms and risks. Hormone replacement therapy is available to decrease symptoms and risks. You should talk to your health care provider about whether hormone replacement therapy is right for you.   Use sunscreen. Apply sunscreen liberally and repeatedly throughout the day. You should seek shade when your shadow is shorter than you. Protect yourself by wearing long sleeves, pants, a wide-brimmed hat, and sunglasses year round, whenever you are outdoors.   Once a month, do a whole body skin exam, using a mirror to look at the skin on your back. Tell your health care provider of new moles, moles that have irregular borders, moles that are larger than a pencil eraser, or moles that have changed in shape or color.   -Stay current with required vaccines (immunizations).   Influenza vaccine. All adults should be immunized every year.  Tetanus, diphtheria, and acellular pertussis (Td, Tdap) vaccine. Pregnant women  should receive 1 dose of Tdap vaccine during each pregnancy. The dose should be obtained regardless of the length of time since the last dose. Immunization is preferred during the 27th 36th week of gestation. An adult who has not previously received Tdap or who does not know her vaccine status should receive 1 dose of Tdap. This initial dose should be followed by tetanus and diphtheria toxoids (Td) booster doses every 10 years. Adults with an unknown or incomplete history of completing a 3-dose immunization series with Td-containing vaccines should begin or complete a primary immunization series including a Tdap dose. Adults should receive a Td booster every 10 years.  Varicella vaccine. An adult without evidence of immunity to varicella should receive 2 doses or a second dose if she has previously received 1 dose. Pregnant females who do not have evidence of immunity should receive the first dose after pregnancy. This first dose should be obtained before leaving the health care facility. The second dose should be obtained 4 8 weeks after the first dose.  Human papillomavirus (HPV) vaccine. Females aged 30 26 years who have not received the vaccine previously should obtain the 3-dose series. The vaccine is not recommended for use in pregnant females. However, pregnancy testing is not needed before receiving a dose. If a female is found to be pregnant after receiving a dose, no  treatment is needed. In that case, the remaining doses should be delayed until after the pregnancy. Immunization is recommended for any person with an immunocompromised condition through the age of 41 years if she did not get any or all doses earlier. During the 3-dose series, the second dose should be obtained 4 8 weeks after the first dose. The third dose should be obtained 24 weeks after the first dose and 16 weeks after the second dose.  Zoster vaccine. One dose is recommended for adults aged 58 years or older unless certain  conditions are present.  Measles, mumps, and rubella (MMR) vaccine. Adults born before [birth year] generally are considered immune to measles and mumps. Adults born in [birth year] or later should have 1 or more doses of MMR vaccine unless there is a contraindication to the vaccine or there is laboratory evidence of immunity to each of the three diseases. A routine second dose of MMR vaccine should be obtained at least 28 days after the first dose for students attending postsecondary schools, health care workers, or international travelers. People who received inactivated measles vaccine or an unknown type of measles vaccine during 1963 1967 should receive 2 doses of MMR vaccine. People who received inactivated mumps vaccine or an unknown type of mumps vaccine before 1979 and are at high risk for mumps infection should consider immunization with 2 doses of MMR vaccine. For females of childbearing age, rubella immunity should be determined. If there is no evidence of immunity, females who are not pregnant should be vaccinated. If there is no evidence of immunity, females who are pregnant should delay immunization until after pregnancy. Unvaccinated health care workers born before [birth year] who lack laboratory evidence of measles, mumps, or rubella immunity or laboratory confirmation of disease should consider measles and mumps immunization with 2 doses of MMR vaccine or rubella immunization with 1 dose of MMR vaccine.  Pneumococcal 13-valent conjugate (PCV13) vaccine. When indicated, a person who is uncertain of her immunization history and has no record of immunization should receive the PCV13 vaccine. An adult aged 71 years or older who has certain medical conditions and has not been previously immunized should receive 1 dose of PCV13 vaccine. This PCV13 should be followed with a dose of pneumococcal polysaccharide (PPSV23) vaccine. The PPSV23 vaccine dose should be obtained at least 8 weeks after the dose of PCV13 vaccine. An  adult aged 37 years or older who has certain medical conditions and previously received 1 or more doses of PPSV23 vaccine should receive 1 dose of PCV13. The PCV13 vaccine dose should be obtained 1 or more years after the last PPSV23 vaccine dose.  Pneumococcal polysaccharide (PPSV23) vaccine. When PCV13 is also indicated, PCV13 should be obtained first. All adults aged 39 years and older should be immunized. An adult younger than age 15 years who has certain medical conditions should be immunized. Any person who resides in a nursing home or long-term care facility should be immunized. An adult smoker should be immunized. People with an immunocompromised condition and certain other conditions should receive both PCV13 and PPSV23 vaccines. People with human immunodeficiency virus (HIV) infection should be immunized as soon as possible after diagnosis. Immunization during chemotherapy or radiation therapy should be avoided. Routine use of PPSV23 vaccine is not recommended for American Indians, [place] Natives, or people younger than 65 years unless there are medical conditions that require PPSV23 vaccine. When indicated, people who have unknown immunization and have no record of immunization should receive PPSV23 vaccine. One-time revaccination  5 years after the first dose of PPSV23 is recommended for people aged 81 64 years who have chronic kidney failure, nephrotic syndrome, asplenia, or immunocompromised conditions. People who received 1 2 doses of PPSV23 before age 46 years should receive another dose of PPSV23 vaccine at age 43 years or later if at least 5 years have passed since the previous dose. Doses of PPSV23 are not needed for people immunized with PPSV23 at or after age 28 years.  Meningococcal vaccine. Adults with asplenia or persistent complement component deficiencies should receive 2 doses of quadrivalent meningococcal conjugate (MenACWY-D) vaccine. The doses should be obtained at least 2 months  apart. Microbiologists working with certain meningococcal bacteria, [place] recruits, people at risk during an outbreak, and people who travel to or live in countries with a high rate of meningitis should be immunized. A first-year college student up through age 42 years who is living in a residence hall should receive a dose if she did not receive a dose on or after her 16th birthday. Adults who have certain high-risk conditions should receive one or more doses of vaccine.  Hepatitis A vaccine. Adults who wish to be protected from this disease, have certain high-risk conditions, work with hepatitis A-infected animals, work in hepatitis A research labs, or travel to or work in countries with a high rate of hepatitis A should be immunized. Adults who were previously unvaccinated and who anticipate close contact with an international adoptee during the first 60 days after arrival in the [place] from a country with a high rate of hepatitis A should be immunized.  Hepatitis B vaccine.  Adults who wish to be protected from this disease, have certain high-risk conditions, may be exposed to blood or other infectious body fluids, are household contacts or sex partners of hepatitis B positive people, are clients or workers in certain care facilities, or travel to or work in countries with a high rate of hepatitis B should be immunized.  Haemophilus influenzae type b (Hib) vaccine. A previously unvaccinated person with asplenia or sickle cell disease or having a scheduled splenectomy should receive 1 dose of Hib vaccine. Regardless of previous immunization, a recipient of a hematopoietic stem cell transplant should receive a 3-dose series 6 12 months after her successful transplant. Hib vaccine is not recommended for adults with HIV infection.  Preventive Services / Frequency Ages 49 to 39years  Blood pressure check.** / Every 1 to 2 years.  Lipid and cholesterol check.** / Every 5 years beginning at age  20.  Clinical breast exam.** / Every 3 years for women in their 75s and 15s.  BRCA-related cancer risk assessment.** / For women who have family members with a BRCA-related cancer (breast, ovarian, tubal, or peritoneal cancers).  Pap test.** / Every 2 years from ages 71 through 81. Every 3 years starting at age 12 through age 63 or 70 with a history of 3 consecutive normal Pap tests.  HPV screening.** / Every 3 years from ages 23 through ages 8 to 48 with a history of 3 consecutive normal Pap tests.  Hepatitis C blood test.** / For any individual with known risks for hepatitis C.  Skin self-exam. / Monthly.  Influenza vaccine. / Every year.  Tetanus, diphtheria, and acellular pertussis (Tdap, Td) vaccine.** / Consult your health care provider. Pregnant women should receive 1 dose of Tdap vaccine during each pregnancy. 1 dose of Td every 10 years.  Varicella vaccine.** / Consult your health care provider. Pregnant females who  do not have evidence of immunity should receive the first dose after pregnancy.  HPV vaccine. / 3 doses over 6 months, if 19 and younger. The vaccine is not recommended for use in pregnant females. However, pregnancy testing is not needed before receiving a dose.  Measles, mumps, rubella (MMR) vaccine.** / You need at least 1 dose of MMR if you were born in 1957 or later. You may also need a 2nd dose. For females of childbearing age, rubella immunity should be determined. If there is no evidence of immunity, females who are not pregnant should be vaccinated. If there is no evidence of immunity, females who are pregnant should delay immunization until after pregnancy.  Pneumococcal 13-valent conjugate (PCV13) vaccine.** / Consult your health care provider.  Pneumococcal polysaccharide (PPSV23) vaccine.** / 1 to 2 doses if you smoke cigarettes or if you have certain conditions.  Meningococcal vaccine.** / 1 dose if you are age 51 to 83 years and a [place] living in a residence hall, or have one of several medical conditions, you need to get vaccinated against meningococcal disease. You may also need additional booster doses.  Hepatitis A vaccine.** / Consult your health care provider.  Hepatitis B vaccine.** / Consult your health care provider.  Haemophilus influenzae type b (Hib) vaccine.** / Consult your health care provider.  Ages 53 to 64years  Blood pressure check.** / Every 1 to 2 years.  Lipid and cholesterol check.** / Every 5 years beginning at age 36 years.  Lung cancer screening. / Every year if you are aged 30 80 years and have a 30-pack-year history of smoking and currently smoke or have quit within the past 15 years. Yearly screening is stopped once you have quit smoking for at least 15 years or develop a health problem that would prevent you from having lung cancer treatment.  Clinical breast exam.** / Every year after age 35 years.  BRCA-related cancer risk assessment.** / For women who have family members with a BRCA-related cancer (breast, ovarian, tubal, or peritoneal cancers).  Mammogram.** / Every year beginning at age 72 years and continuing for as long as you are in good health. Consult with your health care provider.  Pap test.** / Every 3 years starting at age 74 years through age 27 or 46 years with a history of 3 consecutive normal Pap tests.  HPV screening.** / Every 3 years from ages 91 years through ages 41 to 31 years with a history of 3 consecutive normal Pap tests.  Fecal occult blood test (FOBT) of stool. / Every year beginning at age 67 years and continuing until age 32 years. You may not need to do this test if you get a colonoscopy every 10 years.  Flexible sigmoidoscopy or colonoscopy.** / Every 5 years for a flexible sigmoidoscopy or every 10 years for a colonoscopy beginning at age 56 years and continuing until age 31 years.  Hepatitis C blood test.** / For all people born from [birth year]  through 1965 and any individual with known risks for hepatitis C.  Skin self-exam. / Monthly.  Influenza vaccine. / Every year.  Tetanus, diphtheria, and acellular pertussis (Tdap/Td) vaccine.** / Consult your health care provider. Pregnant women should receive 1 dose of Tdap vaccine during each pregnancy. 1 dose of Td every 10 years.  Varicella vaccine.** / Consult your health care provider. Pregnant females who do not have evidence of immunity should receive the first dose after pregnancy.  Zoster vaccine.** / 1 dose  for adults aged 55 years or older.  Measles, mumps, rubella (MMR) vaccine.** / You need at least 1 dose of MMR if you were born in 1957 or later. You may also need a 2nd dose. For females of childbearing age, rubella immunity should be determined. If there is no evidence of immunity, females who are not pregnant should be vaccinated. If there is no evidence of immunity, females who are pregnant should delay immunization until after pregnancy.  Pneumococcal 13-valent conjugate (PCV13) vaccine.** / Consult your health care provider.  Pneumococcal polysaccharide (PPSV23) vaccine.** / 1 to 2 doses if you smoke cigarettes or if you have certain conditions.  Meningococcal vaccine.** / Consult your health care provider.  Hepatitis A vaccine.** / Consult your health care provider.  Hepatitis B vaccine.** / Consult your health care provider.  Haemophilus influenzae type b (Hib) vaccine.** / Consult your health care provider.  Ages 57 years and over  Blood pressure check.** / Every 1 to 2 years.  Lipid and cholesterol check.** / Every 5 years beginning at age 98 years.  Lung cancer screening. / Every year if you are aged 72 80 years and have a 30-pack-year history of smoking and currently smoke or have quit within the past 15 years. Yearly screening is stopped once you have quit smoking for at least 15 years or develop a health problem that would prevent you from having lung  cancer treatment.  Clinical breast exam.** / Every year after age 31 years.  BRCA-related cancer risk assessment.** / For women who have family members with a BRCA-related cancer (breast, ovarian, tubal, or peritoneal cancers).  Mammogram.** / Every year beginning at age 90 years and continuing for as long as you are in good health. Consult with your health care provider.  Pap test.** / Every 3 years starting at age 42 years through age 54 or 23 years with 3 consecutive normal Pap tests. Testing can be stopped between 65 and 70 years with 3 consecutive normal Pap tests and no abnormal Pap or HPV tests in the past 10 years.  HPV screening.** / Every 3 years from ages 33 years through ages 65 or 53 years with a history of 3 consecutive normal Pap tests. Testing can be stopped between 65 and 70 years with 3 consecutive normal Pap tests and no abnormal Pap or HPV tests in the past 10 years.  Fecal occult blood test (FOBT) of stool. / Every year beginning at age 101 years and continuing until age 23 years. You may not need to do this test if you get a colonoscopy every 10 years.  Flexible sigmoidoscopy or colonoscopy.** / Every 5 years for a flexible sigmoidoscopy or every 10 years for a colonoscopy beginning at age 19 years and continuing until age 53 years.  Hepatitis C blood test.** / For all people born from [birth year] through 1967 and any individual with known risks for hepatitis C.  Osteoporosis screening.** / A one-time screening for women ages 78 years and over and women at risk for fractures or osteoporosis.  Skin self-exam. / Monthly.  Influenza vaccine. / Every year.  Tetanus, diphtheria, and acellular pertussis (Tdap/Td) vaccine.** / 1 dose of Td every 10 years.  Varicella vaccine.** / Consult your health care provider.  Zoster vaccine.** / 1 dose for adults aged 45 years or older.  Pneumococcal 13-valent conjugate (PCV13) vaccine.** / Consult your health care provider.  Pneumococcal  polysaccharide (PPSV23) vaccine.** / 1 dose for all adults aged 67 years and older.  Meningococcal vaccine.** / Consult your health care provider.  Hepatitis A vaccine.** / Consult your health care provider.  Hepatitis B vaccine.** / Consult your health care provider.  Haemophilus influenzae type b (Hib) vaccine.** / Consult your health care provider. ** Family history and personal history of risk and conditions may change your health care provider's recommendations. Document Released: 12/23/2001 Document Revised: 08/17/2013  [facility] Patient Information 2014 [place], [place].   EXERCISE AND DIET:  We recommended that you start or continue a regular exercise program for good health. Regular exercise means any activity that makes your heart beat faster and makes you sweat.  We recommend exercising at least 30 minutes per day at least 3 days a week, preferably 5.  We also recommend a diet low in fat and sugar / carbohydrates.  Inactivity, poor dietary choices and obesity can cause diabetes, heart attack, stroke, and kidney damage, among others.     ALCOHOL AND SMOKING:  Women should limit their alcohol intake to no more than 7 drinks/beers/glasses of wine (combined, not each!) per week. Moderation of alcohol intake to this level decreases your risk of breast cancer and liver damage.  ( And of course, no recreational drugs are part of a healthy lifestyle.)  Also, you should not be smoking at all or even being exposed to second hand smoke. Most people know smoking can cause cancer, and various heart and lung diseases, but did you know it also contributes to weakening of your bones?  Aging of your skin?  Yellowing of your teeth and nails?   CALCIUM AND VITAMIN D:  Adequate intake of calcium and Vitamin D are recommended.  The recommendations for exact amounts of these supplements seem to change often, but generally speaking 600 mg of calcium (either carbonate or citrate) and 800 units of Vitamin D  per day seems prudent. Certain women may benefit from higher intake of Vitamin D.  If you are among these women, your doctor will have told you during your visit.     PAP SMEARS:  Pap smears, to check for cervical cancer or precancers,  have traditionally been done yearly, although recent scientific advances have shown that most women can have pap smears less often.  However, every woman still should have a physical exam from her gynecologist or primary care physician every year. It will include a breast check, inspection of the vulva and vagina to check for abnormal growths or skin changes, a visual exam of the cervix, and then an exam to evaluate the size and shape of the uterus and ovaries.  And after 35 years of age, a rectal exam is indicated to check for rectal cancers. We will also provide age appropriate advice regarding health maintenance, like when you should have certain vaccines, screening for sexually transmitted diseases, bone density testing, colonoscopy, mammograms, etc.    MAMMOGRAMS:  All women over 46 years old should have a yearly mammogram. Many facilities now offer a "3D" mammogram, which may cost around $50 extra out of pocket. If possible,  we recommend you accept the option to have the 3D mammogram performed.  It both reduces the number of women who will be called back for extra views which then turn out to be normal, and it is better than the routine mammogram at detecting truly abnormal areas.     COLONOSCOPY:  Colonoscopy to screen for colon cancer is recommended for all women at age 6.  We know, you hate the idea of the prep.  We  agree, BUT, having colon cancer and not knowing it is worse!!  Colon cancer so often starts as a polyp that can be seen and removed at colonscopy, which can quite literally save your life!  And if your first colonoscopy is normal and you have no family history of colon cancer, most women don't have to have it again for 10 years.  Once every ten  years, you can do something that may end up saving your life, right?  We will be happy to help you get it scheduled when you are ready.  Be sure to check your insurance coverage so you understand how much it will cost.  It may be covered as a preventative service at no cost, but you should check your particular policy.

## 2019-06-22 ENCOUNTER — Telehealth: Payer: Self-pay

## 2019-06-22 LAB — HEMOGLOBIN A1C
Est. average glucose Bld gHb Est-mCnc: 103 mg/dL
Hgb A1c MFr Bld: 5.2 % (ref 4.8–5.6)

## 2019-06-22 LAB — COMPREHENSIVE METABOLIC PANEL
ALT: 11 IU/L (ref 0–32)
AST: 22 IU/L (ref 0–40)
Albumin/Globulin Ratio: 2 (ref 1.2–2.2)
Albumin: 4.3 g/dL (ref 3.8–4.8)
Alkaline Phosphatase: 49 IU/L (ref 39–117)
BUN/Creatinine Ratio: 10 (ref 9–23)
BUN: 8 mg/dL (ref 6–20)
Bilirubin Total: 0.5 mg/dL (ref 0.0–1.2)
CO2: 22 mmol/L (ref 20–29)
Calcium: 8.6 mg/dL — ABNORMAL LOW (ref 8.7–10.2)
Chloride: 105 mmol/L (ref 96–106)
Creatinine, Ser: 0.79 mg/dL (ref 0.57–1.00)
GFR calc Af Amer: 112 mL/min/{1.73_m2} (ref >59)
GFR calc non Af Amer: 97 mL/min/{1.73_m2} (ref >59)
Globulin, Total: 2.1 g/dL (ref 1.5–4.5)
Glucose: 82 mg/dL (ref 65–99)
Potassium: 5 mmol/L (ref 3.5–5.2)
Sodium: 141 mmol/L (ref 134–144)
Total Protein: 6.4 g/dL (ref 6.0–8.5)

## 2019-06-22 LAB — T4, FREE: Free T4: 1.13 ng/dL (ref 0.82–1.77)

## 2019-06-22 LAB — CBC WITH DIFFERENTIAL/PLATELET
Basophils Absolute: 0 10*3/uL (ref 0.0–0.2)
Basos: 1 %
EOS (ABSOLUTE): 0.4 10*3/uL (ref 0.0–0.4)
Eos: 6 %
Hematocrit: 43.6 % (ref 34.0–46.6)
Hemoglobin: 14.7 g/dL (ref 11.1–15.9)
Immature Grans (Abs): 0 10*3/uL (ref 0.0–0.1)
Immature Granulocytes: 1 %
Lymphocytes Absolute: 2.1 10*3/uL (ref 0.7–3.1)
Lymphs: 36 %
MCH: 31 pg (ref 26.6–33.0)
MCHC: 33.7 g/dL (ref 31.5–35.7)
MCV: 92 fL (ref 79–97)
Monocytes Absolute: 0.8 10*3/uL (ref 0.1–0.9)
Monocytes: 13 %
Neutrophils Absolute: 2.7 10*3/uL (ref 1.4–7.0)
Neutrophils: 43 %
Platelets: 186 10*3/uL (ref 150–450)
RBC: 4.74 x10E6/uL (ref 3.77–5.28)
RDW: 12.6 % (ref 11.7–15.4)
WBC: 6 10*3/uL (ref 3.4–10.8)

## 2019-06-22 LAB — LIPID PANEL
Chol/HDL Ratio: 2.3 ratio (ref 0.0–4.4)
Cholesterol, Total: 129 mg/dL (ref 100–199)
HDL: 57 mg/dL (ref >39)
LDL Calculated: 56 mg/dL (ref 0–99)
Triglycerides: 79 mg/dL (ref 0–149)
VLDL Cholesterol Cal: 16 mg/dL (ref 5–40)

## 2019-06-22 LAB — TSH: TSH: 1.75 u[IU]/mL (ref 0.450–4.500)

## 2019-06-22 LAB — VITAMIN D 25 HYDROXY (VIT D DEFICIENCY, FRACTURES): Vit D, 25-Hydroxy: 35.5 ng/mL (ref 30.0–100.0)

## 2019-06-22 MED ORDER — VALACYCLOVIR HCL 500 MG PO TABS: 500.0000 mg | ORAL_TABLET | Freq: Two times a day (BID) | ORAL | 1 refills | Status: DC

## 2019-06-22 NOTE — Telephone Encounter (Signed)
Please let patient know that I am highly against her continuing 800mg  of ibuprofen around the clock, daily, for continued use/management of her moderate pain.     Tell her due to the fact that it is not recommended to be taken chronically as well as the very strong chance of side effects if she is taking it this often and for this long, I am not comfortable giving her this medicine.     If she has a chronic pain issue, tell her to please stop taking ibuprofen chronically like she is and that we are happy to send her to a pain physician to address her concerns.

## 2019-06-22 NOTE — Telephone Encounter (Signed)
Spoke to patient and advised her that per Dr. Raliegh Scarlet she needs an office visit for refills on her Ibuprofen 800 mg.  Patient is upset that she is needing an office visit as she was in the office for a physical yesterday.  I informed the patient that this was not address yesterday as she asked about her refills at the end of the visit and that her visit being her yearly physical does not address chronic concerns and that this would need to be another visit.  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  She is questioning the need for another appointment to discuss a pain medication that is not a narcotic and has worked well for her for years.  I tried to explain to the patient that when this medication was filled in the past that she was advised to get refills from her GYN and that per office policy if she is wanting [place] to take over management of her GYN care and this medication that she needs an office visit for refills and to discuss future treatment plan. Patient did make appointment for telehealth for Monday Aug 17th with Dr. Raliegh Scarlet but continues to be upset about having to do so. MPulliam, CMA/RT(R)

## 2019-06-23 LAB — CYTOLOGY - PAP
Diagnosis: NEGATIVE
HPV: NOT DETECTED

## 2019-06-23 NOTE — Telephone Encounter (Signed)
Called and left message for patient to call the office. MPulliam, CMA/RT(R)  

## 2019-06-23 NOTE — Telephone Encounter (Signed)
Spoke to patient she states that medication is used only for menstrual cramps and that she uses it 2-3 day per month max.  The first day of her cycle she takes every 8 hours with a max of 4 tablets and then day 2 and if needed on day 3 she takes two to three times.  Patient states that she doesn't need or take after day 3 of her cycles.  Patient expressed understanding of the concerns of chronic use of the medication but is not sure if taking it like this would warrant the same degree of concern.  Please review and advise.  Patient does have appointment set up for Monday if this would be better for this to be an office visit. MPulliam, CMA/RT(R)

## 2019-06-25 NOTE — Telephone Encounter (Signed)
Let pt know I rec she take famotidine 20mg  bid on days when she takes the ibuprofen, and that she minimize use as much as possible since this med can significantly inc her risk of CV events - Stroke/ AMI etc and also GI bleeds.     If she agrees to this ( please document that), and please send in ibuprofen script 800mg   1 po TID prn mod-severe pain, disp #30 with only 1 rf within a 12 month period.       Thanks!

## 2019-06-27 ENCOUNTER — Other Ambulatory Visit: Payer: Self-pay

## 2019-06-27 ENCOUNTER — Ambulatory Visit: Payer: 59 | Admitting: Family Medicine

## 2019-06-27 NOTE — Telephone Encounter (Signed)
Patient has cancelled her appointment. She states she is going to find care elsewhere.

## 2019-06-27 NOTE — Telephone Encounter (Signed)
Dr. Raliegh Scarlet sent to me over the weekend. I think patient had appt today at 915 with her. MPulliam, CMA/RT(R)

## 2020-04-30 DIAGNOSIS — J302 Other seasonal allergic rhinitis: Secondary | ICD-10-CM | POA: Diagnosis not present

## 2020-04-30 DIAGNOSIS — H6123 Impacted cerumen, bilateral: Secondary | ICD-10-CM | POA: Diagnosis not present

## 2020-10-09 DIAGNOSIS — Z20822 Contact with and (suspected) exposure to covid-19: Secondary | ICD-10-CM | POA: Diagnosis not present

## 2020-10-16 ENCOUNTER — Other Ambulatory Visit: Payer: Self-pay

## 2020-10-16 ENCOUNTER — Ambulatory Visit
Admission: RE | Admit: 2020-10-16 | Discharge: 2020-10-16 | Disposition: A | Discharge status: Home / Self Care | Payer: BC Managed Care – PPO | Source: Ambulatory Visit | Attending: Family Medicine | Admitting: Family Medicine

## 2020-10-16 ENCOUNTER — Ambulatory Visit (INDEPENDENT_AMBULATORY_CARE_PROVIDER_SITE_OTHER): Payer: Managed Care, Other (non HMO)

## 2020-10-16 VITALS — BP 103/73 | HR 86 | Temp 98.3°F | Resp 15 | Ht 67.0 in | Wt 135.0 lb

## 2020-10-16 DIAGNOSIS — R059 Cough, unspecified: Secondary | ICD-10-CM

## 2020-10-16 DIAGNOSIS — R062 Wheezing: Secondary | ICD-10-CM | POA: Diagnosis not present

## 2020-10-16 MED ORDER — PREDNISONE 10 MG PO TABS: 40.0000 mg | ORAL_TABLET | Freq: Every day | ORAL | 0 refills | Status: AC

## 2020-10-16 MED ORDER — AMOXICILLIN-POT CLAVULANATE 875-125 MG PO TABS: 1.0000 | ORAL_TABLET | Freq: Two times a day (BID) | ORAL | 0 refills | Status: DC

## 2020-10-16 MED ORDER — ALBUTEROL SULFATE HFA 108 (90 BASE) MCG/ACT IN AERS
1.0000 | INHALATION_SPRAY | Freq: Four times a day (QID) | RESPIRATORY_TRACT | 0 refills | Status: DC | PRN
Start: 2020-10-16 — End: 2022-03-17

## 2020-10-16 MED ORDER — AZITHROMYCIN 250 MG PO TABS: ORAL_TABLET | ORAL | 0 refills | Status: DC

## 2020-10-16 NOTE — ED Triage Notes (Signed)
Patient c/o nasal congestion, non-productive cough, SOB, fatigue, and wheezing x 10 day.   Patient endorses fever and Headache upon onset of symptoms.   Patient has taken Mucinex and Elderberry w/ no relief of symptoms.   Patient had COVID-19 test done at [facility] w/ a negative result per patient statement.

## 2020-10-16 NOTE — ED Provider Notes (Signed)
Roderic Palau    CSN: [id number] Arrival date & time: 10/16/20  3300      History   Chief Complaint Chief Complaint  Patient presents with  . Cough  . Fatigue    HPI Carolyn Lawrence is a 34 y.o. female.   Patient is a 34 year old female presents today with nasal congestion, productive cough, shortness of breath, fatigue and wheezing for the past 10 days.  Has been taking Mucinex and elderberry without much relief.  Upon onset of symptoms she had fever and headache but this is subsided.  Negative COVID-19 testing done.      Past Medical History:  Diagnosis Date  . HSV infection     Patient Active Problem List   Diagnosis Date Noted  . Family history of breast cancer 06/21/2019  . Cervical high risk HPV (human papillomavirus) test positive- 04/2014 06/22/2018  . Vitamin D deficiency 06/22/2018  . Fibrocystic breast changes, bilateral 06/22/2018  . 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 HSV infection 06/22/2018  . Environmental and seasonal allergies 06/07/2018  . Tobacco use disorder, continuous 06/07/2018  . Tobacco abuse counseling 06/07/2018  . Mild Exp Wheezes 06/07/2018    Past Surgical History:  Procedure Laterality Date  . LEEP    . WISDOM TOOTH EXTRACTION      OB History   No obstetric history on file.      Home Medications    Prior to Admission medications   Medication Sig Start Date End Date Taking? Authorizing Provider  albuterol (VENTOLIN HFA) 108 (90 Base) MCG/ACT inhaler Inhale 1-2 puffs into the lungs every 6 (six) hours as needed for wheezing or shortness of breath. 10/16/20   Loura Halt A, NP  amoxicillin-clavulanate (AUGMENTIN) 875-125 MG tablet Take 1 tablet by mouth every 12 (twelve) hours. 10/16/20   Loura Halt A, NP  ibuprofen (ADVIL) 800 MG tablet Take 1 tablet (800 mg total) by mouth 3 (three) times daily. Office visit needed for refills 04/21/19   Opalski, Neoma Laming, DO  predniSONE (DELTASONE) 10 MG tablet  Take 4 tablets (40 mg total) by mouth daily for 5 days. 10/16/20 10/21/20  Loura Halt A, NP  valACYclovir (VALTREX) 500 MG tablet Take 1 tablet (500 mg total) by mouth 2 (two) times daily. 06/22/19   Mellody Dance, DO    Family History Family History  Problem Relation Age of Onset  . Hypertension Mother   . Hyperlipidemia Mother   . Alcoholism Mother   . Hyperlipidemia Father   . Hypertension Father   . Cancer Other   . Alcoholism Sister   . High Cholesterol Maternal Grandmother   . Breast cancer Maternal Grandmother   . Diabetes Paternal Grandmother   . High Cholesterol Paternal Grandmother   . Stroke Paternal Grandmother   . Heart attack Paternal Grandfather   . High Cholesterol Paternal Grandfather     Social History Social History   Tobacco Use  . Smoking status: Current Some Day Smoker    Packs/day: 0.25    Years: 15.00    Pack years: 3.75    Types: Cigarettes  . Smokeless tobacco: Never Used  . Tobacco comment: occassionally   Vaping Use  . Vaping Use: Never used  Substance Use Topics  . Alcohol use: Yes    Alcohol/week: 10.0 - 15.0 standard drinks    Types: 10 - 15 Standard drinks or equivalent per week  . Drug use: No     Allergies   Patient has  no known allergies.   Review of Systems Review of Systems   Physical Exam Triage Vital Signs ED Triage Vitals  Enc Vitals Group     BP 10/16/20 1011 103/73     Pulse Rate 10/16/20 1011 86     Resp 10/16/20 1011 15     Temp 10/16/20 1011 98.3 F (36.8 C)     Temp Source 10/16/20 1011 Oral     SpO2 10/16/20 1011 95 %     Weight 10/16/20 1008 135 lb (61.2 kg)     Height 10/16/20 1008 5\' 7"  (1.702 m)     Head Circumference --      Peak Flow --      Pain Score 10/16/20 1008 0     Pain Loc --      Pain Edu? --      Excl. in [place]? --    No data found.  Updated Vital Signs BP 103/73 (BP Location: Left Arm)   Pulse 86   Temp 98.3 F (36.8 C) (Oral)   Resp 15   Ht 5\' 7"  (1.702 m)   Wt 135 lb (61.2  kg)   LMP 09/19/2020   SpO2 95%   BMI 21.14 kg/m   Visual Acuity Right Eye Distance:   Left Eye Distance:   Bilateral Distance:    Right Eye Near:   Left Eye Near:    Bilateral Near:     Physical Exam Vitals and nursing note reviewed.  Constitutional:      General: She is not in acute distress.    Appearance: Normal appearance. She is not ill-appearing, toxic-appearing or diaphoretic.  HENT:     Head: Normocephalic and atraumatic.     Right Ear: Tympanic membrane and ear canal normal.     Left Ear: Tympanic membrane and ear canal normal.     Nose: Congestion present.     Mouth/Throat:     Pharynx: Oropharynx is clear.  Eyes:     Conjunctiva/sclera: Conjunctivae normal.  Cardiovascular:     Rate and Rhythm: Normal rate and regular rhythm.  Pulmonary:     Effort: Pulmonary effort is normal.     Breath sounds: Wheezing and rhonchi present.  Musculoskeletal:        General: Normal range of motion.     Cervical back: Normal range of motion.  Skin:    General: Skin is warm and dry.     Findings: No rash.  Neurological:     Mental Status: She is alert.  Psychiatric:        Mood and Affect: Mood normal.      UC Treatments / Results  Labs (all labs ordered are listed, but only abnormal results are displayed) Labs Reviewed - No data to display  EKG   Radiology DG Chest 2 View  Result Date: 10/16/2020 CLINICAL DATA:  Cough for 10 days with wheezing EXAM: CHEST - 2 VIEW COMPARISON:  None. FINDINGS: Generous lung volumes. There is no edema, consolidation, effusion, or pneumothorax. Normal heart size and mediastinal contours. IMPRESSION: Possible hyperinflation.  No acute or focal finding. Electronically Signed   By: Monte Fantasia M.D.   On: 10/16/2020 10:48    Procedures Procedures (including critical care time)  Medications Ordered in UC Medications - No data to display  Initial Impression / Assessment and Plan / UC Course  I have reviewed the triage vital  signs and the nursing notes.  Pertinent labs & imaging results that were available during my care  of the patient were reviewed by me and considered in my medical decision making (see chart for details).     Cough Lungs with mild expiratory wheezing to right lung. X-ray without any concern for pneumonia but had hyperinflated lungs.  Unsure if acute versus chronic.  We will prescribe a short dose of steroids for lung inflammation Albuterol as needed for cough, wheezing or shortness of breath. We will go ahead and cover with amoxicillin at this time for possible sinusitis Verus bacterial bronchitis. Pt has been sick x 10 days or more.  Final Clinical Impressions(s) / UC Diagnoses   Final diagnoses:  Cough     Discharge Instructions     Your x ray was normal, mildly hyperinflated lungs, may be due to bronchitis Inhaler as needed for cough, wheezing, SOB. Recommend allergy medicines.  We will do a short course of steroids for your lungs. Also treating you for a sinus infection with antibiotics Follow up as needed for continued or worsening symptoms     ED Prescriptions    Medication Sig Dispense Auth. Provider   predniSONE (DELTASONE) 10 MG tablet Take 4 tablets (40 mg total) by mouth daily for 5 days. 20 tablet Jt Brabec A, NP   albuterol (VENTOLIN HFA) 108 (90 Base) MCG/ACT inhaler Inhale 1-2 puffs into the lungs every 6 (six) hours as needed for wheezing or shortness of breath. 1 each Dannie Hattabaugh A, NP   azithromycin (ZITHROMAX Z-PAK) 250 MG tablet  (Status: Discontinued) As directed on box 6 tablet Symone Cornman A, NP   amoxicillin-clavulanate (AUGMENTIN) 875-125 MG tablet Take 1 tablet by mouth every 12 (twelve) hours. 14 tablet Fiona Coto A, NP     PDMP not reviewed this encounter.   Orvan July, NP 10/16/20 1119

## 2020-10-16 NOTE — Discharge Instructions (Addendum)
Your x ray was normal, mildly hyperinflated lungs, may be due to bronchitis Inhaler as needed for cough, wheezing, SOB. Recommend allergy medicines.  We will do a short course of steroids for your lungs. Also treating you for a sinus infection with antibiotics Follow up as needed for continued or worsening symptoms

## 2021-04-11 ENCOUNTER — Other Ambulatory Visit: Payer: Self-pay

## 2021-04-11 ENCOUNTER — Ambulatory Visit
Admission: RE | Admit: 2021-04-11 | Discharge: 2021-04-11 | Disposition: A | Discharge status: Home / Self Care | Payer: BC Managed Care – PPO | Source: Ambulatory Visit | Attending: Emergency Medicine | Admitting: Emergency Medicine

## 2021-04-11 VITALS — BP 121/76 | HR 85 | Temp 98.5°F | Resp 16 | Ht 67.0 in | Wt 130.0 lb

## 2021-04-11 DIAGNOSIS — J029 Acute pharyngitis, unspecified: Secondary | ICD-10-CM | POA: Diagnosis not present

## 2021-04-11 LAB — POCT RAPID STREP A (OFFICE): Rapid Strep A Screen: NEGATIVE

## 2021-04-11 NOTE — Discharge Instructions (Signed)
Your rapid strep test is negative.  A throat culture is pending; we will call you if it is positive requiring treatment.    Follow up with your primary care provider if your symptoms are not improving.    

## 2021-04-11 NOTE — ED Provider Notes (Signed)
Roderic Palau    CSN: [id number] Arrival date & time: 04/11/21  1155      History   Chief Complaint Chief Complaint  Patient presents with  . Sore Throat    HPI Carolyn Lawrence is a 35 y.o. female.   Patient presents with 2-day history of sore throat.  She denies fever, rash, cough, shortness of breath, vomiting, diarrhea, or other symptoms.  Treatment attempted at home with elderberry and other supplements.  Her medical history includes current smoker.  The history is provided by the patient.    Past Medical History:  Diagnosis Date  . HSV infection     Patient Active Problem List   Diagnosis Date Noted  . Family history of breast cancer 06/21/2019  . Cervical high risk HPV (human papillomavirus) test positive- 04/2014 06/22/2018  . Vitamin D deficiency 06/22/2018  . Fibrocystic breast changes, bilateral 06/22/2018  . 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 HSV infection 06/22/2018  . Environmental and seasonal allergies 06/07/2018  . Tobacco use disorder, continuous 06/07/2018  . Tobacco abuse counseling 06/07/2018  . Mild Exp Wheezes 06/07/2018    Past Surgical History:  Procedure Laterality Date  . LEEP    . WISDOM TOOTH EXTRACTION      OB History   No obstetric history on file.      Home Medications    Prior to Admission medications   Medication Sig Start Date End Date Taking? Authorizing Provider  albuterol (VENTOLIN HFA) 108 (90 Base) MCG/ACT inhaler Inhale 1-2 puffs into the lungs every 6 (six) hours as needed for wheezing or shortness of breath. 10/16/20   Loura Halt A, NP  amoxicillin-clavulanate (AUGMENTIN) 875-125 MG tablet Take 1 tablet by mouth every 12 (twelve) hours. 10/16/20   Loura Halt A, NP  ibuprofen (ADVIL) 800 MG tablet Take 1 tablet (800 mg total) by mouth 3 (three) times daily. Office visit needed for refills 04/21/19   Opalski, Neoma Laming, DO  valACYclovir (VALTREX) 500 MG tablet Take 1 tablet (500 mg total)  by mouth 2 (two) times daily. 06/22/19   Mellody Dance, DO    Family History Family History  Problem Relation Age of Onset  . Hypertension Mother   . Hyperlipidemia Mother   . Alcoholism Mother   . Hyperlipidemia Father   . Hypertension Father   . Cancer Other   . Alcoholism Sister   . High Cholesterol Maternal Grandmother   . Breast cancer Maternal Grandmother   . Diabetes Paternal Grandmother   . High Cholesterol Paternal Grandmother   . Stroke Paternal Grandmother   . Heart attack Paternal Grandfather   . High Cholesterol Paternal Grandfather     Social History Social History   Tobacco Use  . Smoking status: Current Some Day Smoker    Packs/day: 0.25    Years: 15.00    Pack years: 3.75    Types: Cigarettes  . Smokeless tobacco: Never Used  . Tobacco comment: occassionally   Vaping Use  . Vaping Use: Never used  Substance Use Topics  . Alcohol use: Yes    Alcohol/week: 10.0 - 15.0 standard drinks    Types: 10 - 15 Standard drinks or equivalent per week  . Drug use: No     Allergies   Patient has no known allergies.   Review of Systems Review of Systems  Constitutional: Negative for chills and fever.  HENT: Positive for sore throat. Negative for ear pain.   Respiratory: Negative for cough  and shortness of breath.   Cardiovascular: Negative for chest pain and palpitations.  Gastrointestinal: Negative for abdominal pain, diarrhea and vomiting.  Skin: Negative for color change and rash.  All other systems reviewed and are negative.    Physical Exam Triage Vital Signs ED Triage Vitals  Enc Vitals Group     BP      Pulse      Resp      Temp      Temp src      SpO2      Weight      Height      Head Circumference      Peak Flow      Pain Score      Pain Loc      Pain Edu?      Excl. in [place]?    No data found.  Updated Vital Signs BP 121/76   Pulse 85   Temp 98.5 F (36.9 C) (Oral)   Resp 16   Ht 5\' 7"  (1.702 m)   Wt 130 lb (59 kg)    LMP 04/07/2021   SpO2 96%   BMI 20.36 kg/m   Visual Acuity Right Eye Distance:   Left Eye Distance:   Bilateral Distance:    Right Eye Near:   Left Eye Near:    Bilateral Near:     Physical Exam Vitals and nursing note reviewed.  Constitutional:      General: She is not in acute distress.    Appearance: She is well-developed. She is not ill-appearing.  HENT:     Head: Normocephalic and atraumatic.     Right Ear: Tympanic membrane normal.     Left Ear: Tympanic membrane normal.     Nose: Nose normal.     Mouth/Throat:     Mouth: Mucous membranes are moist.     Pharynx: Posterior oropharyngeal erythema present.  Eyes:     Conjunctiva/sclera: Conjunctivae normal.  Cardiovascular:     Rate and Rhythm: Normal rate and regular rhythm.     Heart sounds: Normal heart sounds.  Pulmonary:     Effort: Pulmonary effort is normal. No respiratory distress.     Breath sounds: Normal breath sounds.  Abdominal:     Palpations: Abdomen is soft.     Tenderness: There is no abdominal tenderness.  Musculoskeletal:     Cervical back: Neck supple.  Skin:    General: Skin is warm and dry.  Neurological:     General: No focal deficit present.     Mental Status: She is alert and oriented to person, place, and time.     Gait: Gait normal.  Psychiatric:        Mood and Affect: Mood normal.        Behavior: Behavior normal.      UC Treatments / Results  Labs (all labs ordered are listed, but only abnormal results are displayed) Labs Reviewed  CULTURE, GROUP A STREP ([place])  NOVEL CORONAVIRUS, NAA  POCT RAPID STREP A (OFFICE)    EKG   Radiology No results found.  Procedures Procedures (including critical care time)  Medications Ordered in UC Medications - No data to display  Initial Impression / Assessment and Plan / UC Course  I have reviewed the triage vital signs and the nursing notes.  Pertinent labs & imaging results that were available during my care of the patient  were reviewed by me and considered in my medical decision making (see chart for  details).   Viral pharyngitis.  Rapid strep negative; culture pending.  PCR COVID pending.  Instructed patient to self quarantine until the test result is back.  Discussed symptomatic treatment including Tylenol or ibuprofen.  Instructed her to follow-up with her PCP if her symptoms are not improving.  She agrees to plan of care.   Final Clinical Impressions(s) / UC Diagnoses   Final diagnoses:  Viral pharyngitis     Discharge Instructions     Your rapid strep test is negative.  A throat culture is pending; we will call you if it is positive requiring treatment.    Follow up with your primary care provider if your symptoms are not improving.        ED Prescriptions    None     PDMP not reviewed this encounter.   Sharion Balloon, NP 04/11/21 [phone]

## 2021-04-11 NOTE — ED Triage Notes (Signed)
Pt reports having a sore throat x2 days. sts there is 1 particular spot that hurts. Neg covid test 5/30.

## 2021-04-12 LAB — SARS-COV-2, NAA 2 DAY TAT

## 2021-04-12 LAB — NOVEL CORONAVIRUS, NAA: SARS-CoV-2, NAA: NOT DETECTED

## 2021-04-14 LAB — CULTURE, GROUP A STREP (THRC)

## 2021-04-22 DIAGNOSIS — L821 Other seborrheic keratosis: Secondary | ICD-10-CM | POA: Diagnosis not present

## 2021-04-22 DIAGNOSIS — Z1283 Encounter for screening for malignant neoplasm of skin: Secondary | ICD-10-CM | POA: Diagnosis not present

## 2022-03-17 ENCOUNTER — Encounter: Payer: Self-pay | Admitting: Nurse Practitioner

## 2022-03-17 ENCOUNTER — Ambulatory Visit (INDEPENDENT_AMBULATORY_CARE_PROVIDER_SITE_OTHER): Payer: BC Managed Care – PPO | Admitting: Nurse Practitioner

## 2022-03-17 VITALS — BP 115/77 | HR 90 | Temp 98.4°F | Ht 65.35 in | Wt 133.4 lb

## 2022-03-17 DIAGNOSIS — N946 Dysmenorrhea, unspecified: Secondary | ICD-10-CM | POA: Diagnosis not present

## 2022-03-17 DIAGNOSIS — J3089 Other allergic rhinitis: Secondary | ICD-10-CM

## 2022-03-17 DIAGNOSIS — Z7689 Persons encountering health services in other specified circumstances: Secondary | ICD-10-CM

## 2022-03-17 MED ORDER — IBUPROFEN 800 MG PO TABS: 800.0000 mg | ORAL_TABLET | Freq: Three times a day (TID) | ORAL | 1 refills | Status: DC

## 2022-03-17 NOTE — Progress Notes (Signed)
? ?New Patient Office Visit ? ?Subjective   ? ?Patient ID: Carolyn Lawrence, female    DOB: 11/03/1986  Age: 36 y.o. MRN: [id number] ? ?CC:  ?Chief Complaint  ?Patient presents with  ? New Patient (Initial Visit)  ? ? ?HPI ?Carolyn Lawrence presents to establish care ?She is reestablishing care in this office.  ?-history of abnormal pap smear in the past.did have LEEP procedure.  Unsure if she is due to have another pap smear. Has got to point when pap smears are every three years. She is due for another pap in 06/2022.  ?-she does need refills of ibuprofen '800mg'$  TID as needed for mensrual cramping. She does need to have refills today. State that she normally needs this on day one and two of her menstrual cycle and doesn't generally need after that.  ?-severe environmental allergies. Would like to have something which is non drowsy.  ? ?Outpatient Encounter Medications as of 03/17/2022  ?Medication Sig  ? valACYclovir (VALTREX) 500 MG tablet Take 1 tablet (500 mg total) by mouth 2 (two) times daily.  ? [DISCONTINUED] ibuprofen (ADVIL) 800 MG tablet Take 1 tablet (800 mg total) by mouth 3 (three) times daily. Office visit needed for refills  ? ibuprofen (ADVIL) 800 MG tablet Take 1 tablet (800 mg total) by mouth 3 (three) times daily. Office visit needed for refills  ? [DISCONTINUED] albuterol (VENTOLIN HFA) 108 (90 Base) MCG/ACT inhaler Inhale 1-2 puffs into the lungs every 6 (six) hours as needed for wheezing or shortness of breath.  ? [DISCONTINUED] amoxicillin-clavulanate (AUGMENTIN) 875-125 MG tablet Take 1 tablet by mouth every 12 (twelve) hours.  ? ?No facility-administered encounter medications on file as of 03/17/2022.  ? ? ?Past Medical History:  ?Diagnosis Date  ? HSV infection   ? ? ?Past Surgical History:  ?Procedure Laterality Date  ? LEEP    ? WISDOM TOOTH EXTRACTION    ? ? ?Family History  ?Problem Relation Age of Onset  ? Hypertension Mother   ? Hyperlipidemia Mother   ? Alcoholism Mother   ? Hyperlipidemia  Father   ? Hypertension Father   ? Cancer Other   ? Alcoholism Sister   ? High Cholesterol Maternal Grandmother   ? Breast cancer Maternal Grandmother   ? Diabetes Paternal Grandmother   ? High Cholesterol Paternal Grandmother   ? Stroke Paternal Grandmother   ? Heart attack Paternal Grandfather   ? High Cholesterol Paternal Grandfather   ? ? ?Social History  ? ?Socioeconomic History  ? Marital status: Single  ?  Spouse name: Not on file  ? Number of children: Not on file  ? Years of education: Not on file  ? Highest education level: Not on file  ?Occupational History  ? Not on file  ?Tobacco Use  ? Smoking status: Some Days  ?  Packs/day: 0.25  ?  Years: 15.00  ?  Pack years: 3.75  ?  Types: Cigarettes  ? Smokeless tobacco: Never  ? Tobacco comments:  ?  occassionally   ?Vaping Use  ? Vaping Use: Never used  ?Substance and Sexual Activity  ? Alcohol use: Yes  ?  Alcohol/week: 10.0 - 15.0 standard drinks  ?  Types: 10 - 15 Standard drinks or equivalent per week  ? Drug use: No  ? Sexual activity: Yes  ?  Birth control/protection: None  ?Other Topics Concern  ? Not on file  ?Social History Narrative  ? Not on file  ? ?Social Determinants  of Health  ? ?Financial Resource Strain: Not on file  ?Food Insecurity: Not on file  ?Transportation Needs: Not on file  ?Physical Activity: Not on file  ?Stress: Not on file  ?Social Connections: Not on file  ?Intimate Partner Violence: Not on file  ? ? ?Review of Systems  ?Constitutional:  Negative for chills, fever and malaise/fatigue.  ?HENT:  Positive for congestion and hearing loss. Negative for sinus pain and sore throat.   ?Eyes: Negative.   ?Respiratory:  Negative for cough, shortness of breath and wheezing.   ?Cardiovascular:  Negative for chest pain, palpitations and leg swelling.  ?Gastrointestinal:  Negative for constipation, diarrhea, nausea and vomiting.  ?Genitourinary: Negative.   ?     History of menstrual cramps  ?Musculoskeletal:  Negative for myalgias.  ?Skin:  Negative.   ?Neurological:  Negative for dizziness and headaches.  ?Endo/Heme/Allergies:  Positive for environmental allergies.  ?Psychiatric/Behavioral:  Negative for depression. The patient is not nervous/anxious.   ? ?  ? ? ?Objective   ? ?Today's Vitals  ? 03/17/22 1405  ?BP: 115/77  ?Pulse: 90  ?Temp: 98.4 ?F (36.9 ?C)  ?SpO2: 97%  ?Weight: 133 lb 6.4 oz (60.5 kg)  ?Height: 5' 5.35" (1.66 m)  ? ?Body mass index is 21.96 kg/m?.  ? ?Physical Exam ?Vitals and nursing note reviewed.  ?Constitutional:   ?   Appearance: Normal appearance. She is well-developed.  ?HENT:  ?   Head: Normocephalic and atraumatic.  ?   Nose: Nose normal.  ?   Mouth/Throat:  ?   Mouth: Mucous membranes are moist.  ?   Pharynx: Oropharynx is clear.  ?Eyes:  ?   Extraocular Movements: Extraocular movements intact.  ?   Conjunctiva/sclera: Conjunctivae normal.  ?   Pupils: Pupils are equal, round, and reactive to light.  ?Cardiovascular:  ?   Rate and Rhythm: Normal rate and regular rhythm.  ?   Pulses: Normal pulses.  ?   Heart sounds: Normal heart sounds.  ?Pulmonary:  ?   Effort: Pulmonary effort is normal.  ?   Breath sounds: Normal breath sounds.  ?Abdominal:  ?   Palpations: Abdomen is soft.  ?Musculoskeletal:     ?   General: Normal range of motion.  ?   Cervical back: Normal range of motion and neck supple.  ?Lymphadenopathy:  ?   Cervical: No cervical adenopathy.  ?Skin: ?   General: Skin is warm and dry.  ?   Capillary Refill: Capillary refill takes less than 2 seconds.  ?Neurological:  ?   General: No focal deficit present.  ?   Mental Status: She is alert and oriented to person, place, and time.  ?Psychiatric:     ?   Mood and Affect: Mood normal.     ?   Behavior: Behavior normal.     ?   Thought Content: Thought content normal.     ?   Judgment: Judgment normal.  ? ? ?  ? ?Assessment & Plan:  ?1. Menstrual cramps ?May take ibuprofen '800mg'$  up to three times daily as needed for menstrual cramps. New prescription sent to her  pharmacy today ?- ibuprofen (ADVIL) 800 MG tablet; Take 1 tablet (800 mg total) by mouth 3 (three) times daily. Office visit needed for refills  Dispense: 270 tablet; Refill: 1 ? ?2. Perennial allergic rhinitis ?Recommend treatment for allegra '180mg'$  daily.  ? ?3. Encounter to establish care ?Appointment today to establish new primary care provider.  ?  ?Problem  List Items Addressed This Visit   ? ?  ? Respiratory  ? Perennial allergic rhinitis  ?  ? Other  ? Menstrual cramps - Primary  ? Relevant Medications  ? ibuprofen (ADVIL) 800 MG tablet  ? ?Other Visit Diagnoses   ? ? Encounter to establish care      ? ?  ? ? ?Return in about 3 months (around 06/17/2022) for health maintenance exam, with pap, FBW a week prior to visit.  ? ?Ronnell Freshwater, NP ? ? ?

## 2022-03-30 ENCOUNTER — Ambulatory Visit (HOSPITAL_COMMUNITY)
Admission: EM | Admit: 2022-03-30 | Discharge: 2022-03-30 | Disposition: A | Discharge status: Home / Self Care | Payer: BC Managed Care – PPO | Attending: Family Medicine | Admitting: Family Medicine

## 2022-03-30 ENCOUNTER — Other Ambulatory Visit: Payer: Self-pay

## 2022-03-30 ENCOUNTER — Ambulatory Visit (INDEPENDENT_AMBULATORY_CARE_PROVIDER_SITE_OTHER): Payer: BC Managed Care – PPO

## 2022-03-30 ENCOUNTER — Encounter (HOSPITAL_COMMUNITY): Payer: Self-pay | Admitting: *Deleted

## 2022-03-30 DIAGNOSIS — M25531 Pain in right wrist: Secondary | ICD-10-CM

## 2022-03-30 MED ORDER — KETOROLAC TROMETHAMINE 30 MG/ML IJ SOLN
30.0000 mg | Freq: Once | INTRAMUSCULAR | Status: AC
  Administered 2022-03-30: 30 mg via INTRAMUSCULAR

## 2022-03-30 MED ORDER — KETOROLAC TROMETHAMINE 30 MG/ML IJ SOLN
INTRAMUSCULAR | Status: AC
  Filled 2022-03-30: qty 1

## 2022-03-30 MED ORDER — ACETAMINOPHEN-CODEINE 300-30 MG PO TABS: 1.0000 | ORAL_TABLET | Freq: Four times a day (QID) | ORAL | 0 refills | Status: DC | PRN

## 2022-03-30 NOTE — Discharge Instructions (Addendum)
The radiologist did not see a fracture on your x-ray of your wrist.  I am concerned about a questionable area on one of your wrist bones.  Ice and elevate the area tonight.  Please contact the hand/orthopedic doctor tomorrow when their office is open   You have been given a shot of toradol 30 mg today  Tylenol with codeine--1 tablet every 6 hours as needed for pain.  Make sure you have something on your stomach when you take

## 2022-03-30 NOTE — ED Triage Notes (Signed)
Pt reports she tripped over a goat today. Pt has pain ,swelling and deformity to RT wrist.

## 2022-03-30 NOTE — ED Provider Notes (Signed)
[place]    CSN: [id number] Arrival date & time: 03/30/22  1248      History   Chief Complaint Chief Complaint  Patient presents with   Arm Injury    HPI Carolyn Lawrence is a 36 y.o. female.    Arm Injury  Here for right wrist pain.  Today she was trying to put a harness on a goat.  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      Home Medications    Prior to Admission medications   Medication Sig Start Date End Date Taking? Authorizing Provider  acetaminophen-codeine (TYLENOL #3) 300-30 MG tablet Take 1 tablet by mouth every 6 (six) hours as needed for moderate pain. 03/30/22  Yes Barrett Henle, MD  ibuprofen (ADVIL) 800 MG tablet Take 1 tablet (800 mg total) by mouth 3 (three) times daily. Office visit needed for refills 03/17/22   Ronnell Freshwater, NP  valACYclovir (VALTREX) 500 MG tablet Take 1 tablet (500 mg total) by mouth 2 (two) times daily. 06/22/19   Mellody Dance, DO    Family History Family History  Problem Relation Age of Onset   Hypertension Mother    Hyperlipidemia Mother    Alcoholism  Mother    Hyperlipidemia Father    Hypertension Father    Alcoholism Sister    High Cholesterol Maternal Grandmother    Breast cancer Maternal Grandmother    Diabetes Paternal Grandmother    High Cholesterol Paternal Grandmother    Stroke Paternal Grandmother    Heart attack Paternal Grandfather    High Cholesterol Paternal Grandfather    Cancer Other     Social History Social History   Tobacco Use   Smoking status: Some Days    Packs/day: 0.25    Years: 15.00    Pack years: 3.75    Types: Cigarettes   Smokeless tobacco: Never   Tobacco comments:    occassionally   Vaping Use   Vaping Use: Never used  Substance Use Topics   Alcohol use: Yes    Alcohol/week: 10.0 - 15.0 standard drinks    Types: 10 - 15 Standard drinks or equivalent per week   Drug use: No     Allergies   Patient has no known allergies.   Review of Systems Review of Systems   Physical Exam Triage Vital Signs ED Triage Vitals  Enc Vitals Group     BP 03/30/22 1404 119/82     Pulse Rate 03/30/22 1404 77     Resp 03/30/22 1404 18  Temp 03/30/22 1404 98.6 F (37 C)     Temp src --      SpO2 03/30/22 1404 95 %     Weight --      Height --      Head Circumference --      Peak Flow --      Pain Score 03/30/22 1401 7     Pain Loc --      Pain Edu? --      Excl. in [place]? --    No data found.  Updated Vital Signs BP 119/82   Pulse 77   Temp 98.6 F (37 C)   Resp 18   LMP 02/28/2022   SpO2 95%   Visual Acuity Right Eye Distance:   Left Eye Distance:   Bilateral Distance:    Right Eye Near:   Left Eye Near:    Bilateral Near:     Physical Exam Vitals reviewed.  Constitutional:      General: She is not in acute distress.    Appearance: She is not toxic-appearing.  Musculoskeletal:     Comments: She has tenderness and swelling over the dorsum of her right wrist on the radial side.  The swelling is about 3 cm in diameter.  She is able to flex and extend her fingers.   Neurovascular is intact     UC Treatments / Results  Labs (all labs ordered are listed, but only abnormal results are displayed) Labs Reviewed - No data to display  EKG   Radiology DG Wrist Complete Right  Result Date: 03/30/2022 CLINICAL DATA:  Pain after fall. EXAM: RIGHT WRIST - COMPLETE 3+ VIEW COMPARISON:  None Available. FINDINGS: There is no evidence of fracture or dislocation. There is no evidence of arthropathy or other focal bone abnormality. Soft tissues are unremarkable. IMPRESSION: Negative. Electronically Signed   By: Dorise Bullion III M.D.   On: 03/30/2022 14:38    Procedures Procedures (including critical care time)  Medications Ordered in UC Medications  ketorolac (TORADOL) 30 MG/ML injection 30 mg (has no administration in time range)    Initial Impression / Assessment and Plan / UC Course  I have reviewed the triage vital signs and the nursing notes.  Pertinent labs & imaging results that were available during my care of the patient were reviewed by me and considered in my medical decision making (see chart for details).     X-rays read as negative.  On my review there is a questionable area on her scaphoid.  Discussed with her.  I am going to provide pain relief and a wrist splint.  She will contact the hand doctor on call tomorrow when their office is open  Final diagnoses:  Right wrist pain     Discharge Instructions      The radiologist did not see a fracture on your x-ray of your wrist.  I am concerned about a questionable area on one of your wrist bones.  Ice and elevate the area tonight.  Please contact the hand/orthopedic doctor tomorrow when their office is open   You have been given a shot of toradol 30 mg today  Tylenol with codeine--1 tablet every 6 hours as needed for pain.  Make sure you have something on your stomach when you take     ED Prescriptions     Medication Sig Dispense Auth. Provider   acetaminophen-codeine  (TYLENOL #3) 300-30 MG tablet Take 1 tablet by mouth every 6 (six) hours as needed  for moderate pain. 12 tablet Quinteria Chisum, Gwenlyn Perking, MD      I have reviewed the PDMP during this encounter.   Barrett Henle, MD 03/30/22 1455

## 2022-03-31 DIAGNOSIS — S60211A Contusion of right wrist, initial encounter: Secondary | ICD-10-CM | POA: Diagnosis not present

## 2022-04-09 DIAGNOSIS — S60211D Contusion of right wrist, subsequent encounter: Secondary | ICD-10-CM | POA: Diagnosis not present

## 2022-05-09 DIAGNOSIS — S60211D Contusion of right wrist, subsequent encounter: Secondary | ICD-10-CM | POA: Diagnosis not present

## 2022-06-05 ENCOUNTER — Other Ambulatory Visit: Payer: Self-pay

## 2022-06-05 DIAGNOSIS — Z130 Encounter for screening for diseases of the blood and blood-forming organs and certain disorders involving the immune mechanism: Secondary | ICD-10-CM

## 2022-06-05 DIAGNOSIS — Z0000 Encounter for general adult medical examination without abnormal findings: Secondary | ICD-10-CM

## 2022-06-10 ENCOUNTER — Other Ambulatory Visit: Payer: BC Managed Care – PPO

## 2022-06-10 DIAGNOSIS — Z0000 Encounter for general adult medical examination without abnormal findings: Secondary | ICD-10-CM | POA: Diagnosis not present

## 2022-06-10 DIAGNOSIS — Z13228 Encounter for screening for other metabolic disorders: Secondary | ICD-10-CM | POA: Diagnosis not present

## 2022-06-10 DIAGNOSIS — Z1329 Encounter for screening for other suspected endocrine disorder: Secondary | ICD-10-CM | POA: Diagnosis not present

## 2022-06-10 DIAGNOSIS — Z130 Encounter for screening for diseases of the blood and blood-forming organs and certain disorders involving the immune mechanism: Secondary | ICD-10-CM

## 2022-06-10 DIAGNOSIS — Z1321 Encounter for screening for nutritional disorder: Secondary | ICD-10-CM | POA: Diagnosis not present

## 2022-06-11 LAB — HEMOGLOBIN A1C
Est. average glucose Bld gHb Est-mCnc: 100 mg/dL
Hgb A1c MFr Bld: 5.1 % (ref 4.8–5.6)

## 2022-06-11 LAB — COMPREHENSIVE METABOLIC PANEL
ALT: 11 IU/L (ref 0–32)
AST: 16 IU/L (ref 0–40)
Albumin/Globulin Ratio: 1.8 (ref 1.2–2.2)
Albumin: 4.4 g/dL (ref 3.9–4.9)
Alkaline Phosphatase: 43 IU/L — ABNORMAL LOW (ref 44–121)
BUN/Creatinine Ratio: 12 (ref 9–23)
BUN: 9 mg/dL (ref 6–20)
Bilirubin Total: 0.2 mg/dL (ref 0.0–1.2)
CO2: 19 mmol/L — ABNORMAL LOW (ref 20–29)
Calcium: 9.1 mg/dL (ref 8.7–10.2)
Chloride: 106 mmol/L (ref 96–106)
Creatinine, Ser: 0.74 mg/dL (ref 0.57–1.00)
Globulin, Total: 2.4 g/dL (ref 1.5–4.5)
Glucose: 83 mg/dL (ref 70–99)
Potassium: 4.7 mmol/L (ref 3.5–5.2)
Sodium: 141 mmol/L (ref 134–144)
Total Protein: 6.8 g/dL (ref 6.0–8.5)
eGFR: 106 mL/min/{1.73_m2} (ref >59)

## 2022-06-11 LAB — CBC
Hematocrit: 44.5 % (ref 34.0–46.6)
Hemoglobin: 14.5 g/dL (ref 11.1–15.9)
MCH: 30.9 pg (ref 26.6–33.0)
MCHC: 32.6 g/dL (ref 31.5–35.7)
MCV: 95 fL (ref 79–97)
Platelets: 276 10*3/uL (ref 150–450)
RBC: 4.7 x10E6/uL (ref 3.77–5.28)
RDW: 12.7 % (ref 11.7–15.4)
WBC: 5.5 10*3/uL (ref 3.4–10.8)

## 2022-06-11 LAB — LIPID PANEL
Chol/HDL Ratio: 2.7 ratio (ref 0.0–4.4)
Cholesterol, Total: 154 mg/dL (ref 100–199)
HDL: 57 mg/dL (ref >39)
LDL Chol Calc (NIH): 75 mg/dL (ref 0–99)
Triglycerides: 127 mg/dL (ref 0–149)
VLDL Cholesterol Cal: 22 mg/dL (ref 5–40)

## 2022-06-11 LAB — TSH: TSH: 1.42 u[IU]/mL (ref 0.450–4.500)

## 2022-06-11 NOTE — Progress Notes (Signed)
Labs look great. Discuss at next visit 06/17/2022

## 2022-06-17 ENCOUNTER — Encounter: Payer: BC Managed Care – PPO | Admitting: Nurse Practitioner

## 2022-07-08 ENCOUNTER — Ambulatory Visit (INDEPENDENT_AMBULATORY_CARE_PROVIDER_SITE_OTHER): Payer: BC Managed Care – PPO | Admitting: Nurse Practitioner

## 2022-07-08 ENCOUNTER — Encounter: Payer: Self-pay | Admitting: Nurse Practitioner

## 2022-07-08 ENCOUNTER — Other Ambulatory Visit (HOSPITAL_COMMUNITY)
Admission: RE | Admit: 2022-07-08 | Discharge: 2022-07-08 | Disposition: A | Discharge status: Home / Self Care | Payer: BC Managed Care – PPO | Source: Ambulatory Visit | Attending: Nurse Practitioner | Admitting: Nurse Practitioner

## 2022-07-08 VITALS — BP 96/60 | HR 66 | Ht 65.35 in | Wt 134.1 lb

## 2022-07-08 DIAGNOSIS — Z8742 Personal history of other diseases of the female genital tract: Secondary | ICD-10-CM

## 2022-07-08 DIAGNOSIS — Z01419 Encounter for gynecological examination (general) (routine) without abnormal findings: Secondary | ICD-10-CM

## 2022-07-08 DIAGNOSIS — D4862 Neoplasm of uncertain behavior of left breast: Secondary | ICD-10-CM

## 2022-07-08 DIAGNOSIS — N946 Dysmenorrhea, unspecified: Secondary | ICD-10-CM | POA: Diagnosis not present

## 2022-07-08 MED ORDER — IBUPROFEN 800 MG PO TABS: 800.0000 mg | ORAL_TABLET | Freq: Three times a day (TID) | ORAL | 1 refills | Status: DC

## 2022-07-08 NOTE — Progress Notes (Signed)
Complete physical exam   Patient: Carolyn Lawrence   DOB: [birth year]   36 y.o. Female  MRN: [id number] Visit Date: 07/08/2022    Chief Complaint  Patient presents with   Annual Exam   Gynecologic Exam   Subjective    Carolyn Lawrence is a 36 y.o. female who presents today for a complete physical exam.  She reports consuming a  generally healthy  diet. The patient does not participate in regular exercise at present. She generally feels well. She does not have additional problems to discuss today.   HPI  Annual physical with pap  -history of abnormal pap which resulted in LEEP procedure. Since then, she had normal paps.  -routine fasting labs done prior to this visit were essentially normal.  -no physical concerns or complaints at this time.   Past Medical History:  Diagnosis Date   HSV infection    Past Surgical History:  Procedure Laterality Date   LEEP     WISDOM TOOTH EXTRACTION     Social History   Socioeconomic History   Marital status: Single    Spouse name: Not on file   Number of children: Not on file   Years of education: Not on file   Highest education level: Not on file  Occupational History   Not on file  Tobacco Use   Smoking status: Some Days    Packs/day: 0.25    Years: 15.00    Total pack years: 3.75    Types: Cigarettes   Smokeless tobacco: Never   Tobacco comments:    occassionally   Vaping Use   Vaping Use: Never used  Substance and Sexual Activity   Alcohol use: Yes    Alcohol/week: 10.0 - 15.0 standard drinks of alcohol    Types: 10 - 15 Standard drinks or equivalent per week   Drug use: No   Sexual activity: Yes    Birth control/protection: None  Other Topics Concern   Not on file  Social History Narrative   Not on file   Social Determinants of Health   Financial Resource Strain: Not on file  Food Insecurity: Not on file  Transportation Needs: Not on file  Physical Activity: Not on file  Stress: Not on file  Social Connections:  Not on file  Intimate Partner Violence: Not on file   Family Status  Relation Name Status   Mother  Alive   Father  Alive   Sister  (Not Specified)   MGM  (Not Specified)   PGM  (Not Specified)   PGF  (Not Specified)   Other  (Not Specified)   Family History  Problem Relation Age of Onset   Hypertension Mother    Hyperlipidemia Mother    Alcoholism Mother    Hyperlipidemia Father    Hypertension Father    Alcoholism Sister    High Cholesterol Maternal Grandmother    Breast cancer Maternal Grandmother    Diabetes Paternal Grandmother    High Cholesterol Paternal Grandmother    Stroke Paternal Grandmother    Heart attack Paternal Grandfather    High Cholesterol Paternal Grandfather    Cancer Other    No Known Allergies  Patient Care Team: Ronnell Freshwater, NP as PCP - General (Family Medicine)   Medications: Outpatient Medications Prior to Visit  Medication Sig   valACYclovir (VALTREX) 500 MG tablet Take 1 tablet (500 mg total) by mouth 2 (two) times daily.   [DISCONTINUED] ibuprofen (ADVIL) 800 MG tablet Take 1  tablet (800 mg total) by mouth 3 (three) times daily. Office visit needed for refills   [DISCONTINUED] acetaminophen-codeine (TYLENOL #3) 300-30 MG tablet Take 1 tablet by mouth every 6 (six) hours as needed for moderate pain.   No facility-administered medications prior to visit.    Review of Systems  Constitutional:  Negative for activity change, appetite change, chills, fatigue and fever.  HENT:  Negative for congestion, postnasal drip, rhinorrhea, sinus pressure, sinus pain, sneezing and sore throat.   Eyes: Negative.   Respiratory:  Negative for cough, chest tightness, shortness of breath and wheezing.   Cardiovascular:  Negative for chest pain and palpitations.  Gastrointestinal:  Negative for abdominal pain, constipation, diarrhea, nausea and vomiting.  Endocrine: Negative for cold intolerance, heat intolerance, polydipsia and polyuria.   Genitourinary:  Negative for dyspareunia, dysuria, flank pain, frequency and urgency.  Musculoskeletal:  Negative for arthralgias, back pain and myalgias.  Skin:  Negative for rash.  Allergic/Immunologic: Negative for environmental allergies.  Neurological:  Negative for dizziness, weakness and headaches.  Hematological:  Negative for adenopathy.  Psychiatric/Behavioral:  The patient is not nervous/anxious.     Last CBC Lab Results  Component Value Date   WBC 5.5 06/10/2022   HGB 14.5 06/10/2022   HCT 44.5 06/10/2022   MCV 95 06/10/2022   MCH 30.9 06/10/2022   RDW 12.7 06/10/2022   PLT 276 22/97/9892   Last metabolic panel Lab Results  Component Value Date   GLUCOSE 83 06/10/2022   NA 141 06/10/2022   K 4.7 06/10/2022   CL 106 06/10/2022   CO2 19 (L) 06/10/2022   BUN 9 06/10/2022   CREATININE 0.74 06/10/2022   EGFR 106 06/10/2022   CALCIUM 9.1 06/10/2022   PROT 6.8 06/10/2022   ALBUMIN 4.4 06/10/2022   LABGLOB 2.4 06/10/2022   AGRATIO 1.8 06/10/2022   BILITOT 0.2 06/10/2022   ALKPHOS 43 (L) 06/10/2022   AST 16 06/10/2022   ALT 11 06/10/2022   Last lipids Lab Results  Component Value Date   CHOL 154 06/10/2022   HDL 57 06/10/2022   LDLCALC 75 06/10/2022   TRIG 127 06/10/2022   CHOLHDL 2.7 06/10/2022   Last hemoglobin A1c Lab Results  Component Value Date   HGBA1C 5.1 06/10/2022   Last thyroid functions Lab Results  Component Value Date   TSH 1.420 06/10/2022   Last vitamin D Lab Results  Component Value Date   VD25OH 35.5 06/21/2019       Objective     Today's Vitals   07/08/22 1418  BP: 96/60  Pulse: 66  SpO2: 98%  Weight: 134 lb 1.9 oz (60.8 kg)  Height: 5' 5.35" (1.66 m)   Body mass index is 22.08 kg/m.   BP Readings from Last 3 Encounters:  07/08/22 96/60  03/30/22 119/82  03/17/22 115/77    Wt Readings from Last 3 Encounters:  07/08/22 134 lb 1.9 oz (60.8 kg)  03/17/22 133 lb 6.4 oz (60.5 kg)  04/11/21 130 lb (59 kg)      Physical Exam Vitals and nursing note reviewed. Exam conducted with a chaperone present.  Constitutional:      Appearance: Normal appearance. She is well-developed.  HENT:     Head: Normocephalic and atraumatic.     Right Ear: Tympanic membrane, ear canal and external ear normal.     Left Ear: Tympanic membrane, ear canal and external ear normal.     Nose: Nose normal.     Mouth/Throat:  Mouth: Mucous membranes are moist.     Pharynx: Oropharynx is clear.  Eyes:     Extraocular Movements: Extraocular movements intact.     Conjunctiva/sclera: Conjunctivae normal.     Pupils: Pupils are equal, round, and reactive to light.  Cardiovascular:     Rate and Rhythm: Normal rate and regular rhythm.     Pulses: Normal pulses.     Heart sounds: Normal heart sounds.  Pulmonary:     Effort: Pulmonary effort is normal.     Breath sounds: Normal breath sounds.  Chest:  Breasts:    Right: Normal. No swelling, bleeding, inverted nipple, mass, nipple discharge, skin change or tenderness.     Left: Mass present. No swelling, bleeding, inverted nipple, nipple discharge, skin change or tenderness.    Abdominal:     General: Bowel sounds are normal. There is no distension.     Palpations: Abdomen is soft. There is no mass.     Tenderness: There is no abdominal tenderness. There is no right CVA tenderness, left CVA tenderness, guarding or rebound.     Hernia: No hernia is present. There is no hernia in the left inguinal area or right inguinal area.  Genitourinary:    General: Normal vulva.     Exam position: Supine.     Labia:        Right: No rash, tenderness, lesion or injury.        Left: No rash, tenderness, lesion or injury.      Vagina: Normal. No signs of injury and foreign body. No vaginal discharge, erythema, tenderness, bleeding or prolapsed vaginal walls.     Cervix: No cervical motion tenderness, discharge, friability, lesion, erythema, cervical bleeding or eversion.     Uterus:  Not deviated, not enlarged, not fixed, not tender and no uterine prolapse.      Adnexa: Right adnexa normal and left adnexa normal.     Comments: No tenderness, masses, or organomeglay present during bimanual exam .   Musculoskeletal:        General: Normal range of motion.     Cervical back: Normal range of motion and neck supple.  Lymphadenopathy:     Cervical: No cervical adenopathy.     Upper Body:     Right upper body: No axillary adenopathy.     Left upper body: No axillary adenopathy.     Lower Body: No right inguinal adenopathy. No left inguinal adenopathy.  Skin:    General: Skin is warm and dry.     Capillary Refill: Capillary refill takes less than 2 seconds.  Neurological:     General: No focal deficit present.     Mental Status: She is alert and oriented to person, place, and time.  Psychiatric:        Mood and Affect: Mood normal.        Behavior: Behavior normal.        Thought Content: Thought content normal.        Judgment: Judgment normal.     Last depression screening scores    07/08/2022    2:26 PM 03/17/2022    2:10 PM 07/21/2018    8:09 AM  PHQ 2/9 Scores  PHQ - 2 Score 0 0 0  PHQ- 9 Score 0 0 0   Last fall risk screening    06/07/2018    3:58 PM  Fall Risk   Falls in the past year? No     Results for orders placed or performed  in visit on 07/08/22  Cytology - PAP( [place])  Result Value Ref Range   High risk HPV Negative    Adequacy      Satisfactory for evaluation; transformation zone component ABSENT.   Diagnosis (A)     - Atypical squamous cells of undetermined significance (ASC-US)   Comment Normal Reference Range HPV - Negative     Assessment & Plan    1. Well woman exam Annual physical with Pap smear today. - Cytology - PAP( [place])  2. Menstrual cramps Maintain ibuprofen 800 mg up to 3 times daily when needed for menstrual cramps. - ibuprofen (ADVIL) 800 MG tablet; Take 1 tablet (800 mg total) by mouth 3 (three) times  daily. Office visit needed for refills  Dispense: 90 tablet; Refill: 1  3. 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 - US BREAST LTD UNI LEFT INC AXILLA; Future  4. History of abnormal cervical Pap smear History of abnormal Pap smear resulting in LEEP procedure.  Normal Pap smears since.   Immunization History  Administered Date(s) Administered   DTaP 03/31/1984, 06/03/1984, 07/02/1984, 08/23/1985, 01/31/1988   HIB (PRP-OMP) 09/24/2010, 11/12/2011, 09/10/2012   Hepatitis A, Adult 11/27/2015   Hepatitis A, Ped/Adol-2 Dose 02/09/2007   Hepatitis B, PED/ADOLESCENT 08/17/1995, 09/22/1995, 02/16/1996   IPV 04/03/1984, 06/03/1984, 03/02/1985, 06/25/1989   Influenza Split 08/24/2014, 08/08/2015   MMR 04/29/1985, 12/01/2001   PFIZER(Purple Top)SARS-COV-2 Vaccination 02/07/2020, 02/28/2020, 09/24/2020   Td 12/01/2001   Tdap 06/16/2007, 06/21/2019    Health Maintenance  Topic Date Due   COVID-19 Vaccine (4 - Pfizer risk series) 11/19/2020   INFLUENZA VACCINE  06/10/2022   Hepatitis C Screening  03/18/2023 (Originally 01/23/2002)   HIV Screening  03/18/2023 (Originally 01/24/1999)   PAP SMEAR-Modifier  07/08/2025   TETANUS/TDAP  06/20/2029   HPV VACCINES  Aged Out    Discussed health benefits of physical activity, and encouraged her to engage in regular exercise appropriate for her age and condition.  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         Ronnell Freshwater, NP  [facility] at [facility] [phone] (phone) [phone] (fax)  [place]

## 2022-07-09 ENCOUNTER — Other Ambulatory Visit: Payer: Self-pay | Admitting: Nurse Practitioner

## 2022-07-09 DIAGNOSIS — D4862 Neoplasm of uncertain behavior of left breast: Secondary | ICD-10-CM

## 2022-07-16 LAB — CYTOLOGY - PAP
Adequacy: ABSENT
Comment: NEGATIVE
Diagnosis: UNDETERMINED — AB
High risk HPV: NEGATIVE

## 2022-07-16 NOTE — Progress Notes (Signed)
Please let the patient know that her pap smear shows some atypical cells with no evidence of malignancy at this time. The HPV is negative. The recommendation is to repeat her pap smear next year when we do her annual physical.  Thanks so much.   -HB

## 2022-07-27 DIAGNOSIS — D4862 Neoplasm of uncertain behavior of left breast: Secondary | ICD-10-CM | POA: Insufficient documentation

## 2022-07-27 DIAGNOSIS — Z8742 Personal history of other diseases of the female genital tract: Secondary | ICD-10-CM | POA: Insufficient documentation

## 2022-08-20 ENCOUNTER — Ambulatory Visit
Admission: RE | Admit: 2022-08-20 | Discharge: 2022-08-20 | Disposition: A | Discharge status: Home / Self Care | Payer: BC Managed Care – PPO | Source: Ambulatory Visit | Attending: Nurse Practitioner | Admitting: Nurse Practitioner

## 2022-08-20 DIAGNOSIS — R922 Inconclusive mammogram: Secondary | ICD-10-CM | POA: Diagnosis not present

## 2022-08-20 DIAGNOSIS — D4862 Neoplasm of uncertain behavior of left breast: Secondary | ICD-10-CM

## 2022-08-20 DIAGNOSIS — N6002 Solitary cyst of left breast: Secondary | ICD-10-CM | POA: Diagnosis not present

## 2022-08-24 NOTE — Progress Notes (Signed)
Benign mammogram results. Repeat study once yearly.

## 2022-08-24 NOTE — Progress Notes (Signed)
Benign mammogram/ultrasound results. Repeat screening mammogram yearly.

## 2023-01-21 DIAGNOSIS — Z23 Encounter for immunization: Secondary | ICD-10-CM | POA: Diagnosis not present

## 2023-01-29 ENCOUNTER — Other Ambulatory Visit: Payer: Self-pay

## 2023-01-29 DIAGNOSIS — Z111 Encounter for screening for respiratory tuberculosis: Secondary | ICD-10-CM

## 2023-01-29 DIAGNOSIS — Z0184 Encounter for antibody response examination: Secondary | ICD-10-CM

## 2023-01-30 ENCOUNTER — Other Ambulatory Visit: Payer: Medicaid Other

## 2023-01-30 DIAGNOSIS — Z0184 Encounter for antibody response examination: Secondary | ICD-10-CM

## 2023-01-30 DIAGNOSIS — Z111 Encounter for screening for respiratory tuberculosis: Secondary | ICD-10-CM | POA: Diagnosis not present

## 2023-01-31 ENCOUNTER — Encounter: Payer: Self-pay | Admitting: Nurse Practitioner

## 2023-01-31 LAB — MEASLES/MUMPS/RUBELLA IMMUNITY
MUMPS ABS, IGG: 13.1 AU/mL (ref >10.9)
RUBEOLA AB, IGG: 58.3 AU/mL (ref >16.4)
Rubella Antibodies, IGG: 1 index (ref >0.99)

## 2023-01-31 LAB — VARICELLA ZOSTER ANTIBODY, IGG: Varicella zoster IgG: 538 index (ref >165)

## 2023-01-31 LAB — HEPATITIS B CORE ANTIBODY, IGM: Hep B C IgM: NEGATIVE

## 2023-02-02 NOTE — Telephone Encounter (Signed)
Do you know if we have the vaccine she is looking for, the Heplisav - B? Two shot vaccine series for people who did not respond to initial hepatitis B vaccine.

## 2023-02-10 LAB — QUANTIFERON-TB GOLD PLUS
QuantiFERON Mitogen Value: >10 IU/mL
QuantiFERON Nil Value: 2.8 IU/mL
QuantiFERON TB1 Ag Value: 0.6 IU/mL
QuantiFERON TB2 Ag Value: 0.49 IU/mL
QuantiFERON-TB Gold Plus: NEGATIVE

## 2023-02-11 ENCOUNTER — Encounter: Payer: Self-pay | Admitting: Nurse Practitioner

## 2023-02-11 ENCOUNTER — Ambulatory Visit (INDEPENDENT_AMBULATORY_CARE_PROVIDER_SITE_OTHER): Payer: BC Managed Care – PPO | Admitting: Nurse Practitioner

## 2023-02-11 VITALS — BP 106/73 | HR 85 | Ht 65.35 in | Wt 135.4 lb

## 2023-02-11 DIAGNOSIS — F17200 Nicotine dependence, unspecified, uncomplicated: Secondary | ICD-10-CM

## 2023-02-11 DIAGNOSIS — F410 Panic disorder [episodic paroxysmal anxiety] without agoraphobia: Secondary | ICD-10-CM

## 2023-02-11 DIAGNOSIS — Z0184 Encounter for antibody response examination: Secondary | ICD-10-CM | POA: Diagnosis not present

## 2023-02-11 DIAGNOSIS — F411 Generalized anxiety disorder: Secondary | ICD-10-CM

## 2023-02-11 MED ORDER — BUPROPION HCL ER (XL) 150 MG PO TB24
150.0000 mg | ORAL_TABLET | Freq: Every day | ORAL | 2 refills | Status: DC
Start: 2023-02-11 — End: 2023-08-14

## 2023-02-11 NOTE — Progress Notes (Signed)
Established patient visit   Patient: Carolyn Lawrence   DOB: November 05, 1986   37 y.o. Female  MRN: [id number] Visit Date: 02/11/2023   Chief Complaint  Patient presents with   office visit   Subjective    HPI  Follow up  -current everyday smoker.  -will smoke between 1 and 10 cigarettes in any given day.  -generally smokes less than more.  -states that she has been smoking like this for years. Will smoke more cigarettes in social situation.  -she is interested in smoking cessation.  -needs to have follow up lab to verify immunity to Hepatitis B  Medications: Outpatient Medications Prior to Visit  Medication Sig   ibuprofen (ADVIL) 800 MG tablet Take 1 tablet (800 mg total) by mouth 3 (three) times daily. Office visit needed for refills   valACYclovir (VALTREX) 500 MG tablet Take 1 tablet (500 mg total) by mouth 2 (two) times daily.   No facility-administered medications prior to visit.    Review of Systems See HPI    Last CBC Lab Results  Component Value Date   WBC 5.5 06/10/2022   HGB 14.5 06/10/2022   HCT 44.5 06/10/2022   MCV 95 06/10/2022   MCH 30.9 06/10/2022   RDW 12.7 06/10/2022   PLT 276 06/10/2022   Last metabolic panel Lab Results  Component Value Date   GLUCOSE 83 06/10/2022   NA 141 06/10/2022   K 4.7 06/10/2022   CL 106 06/10/2022   CO2 19 (L) 06/10/2022   BUN 9 06/10/2022   CREATININE 0.74 06/10/2022   EGFR 106 06/10/2022   CALCIUM 9.1 06/10/2022   PROT 6.8 06/10/2022   ALBUMIN 4.4 06/10/2022   LABGLOB 2.4 06/10/2022   AGRATIO 1.8 06/10/2022   BILITOT 0.2 06/10/2022   ALKPHOS 43 (L) 06/10/2022   AST 16 06/10/2022   ALT 11 06/10/2022   Last lipids Lab Results  Component Value Date   CHOL 154 06/10/2022   HDL 57 06/10/2022   LDLCALC 75 06/10/2022   TRIG 127 06/10/2022   CHOLHDL 2.7 06/10/2022   Last hemoglobin A1c Lab Results  Component Value Date   HGBA1C 5.1 06/10/2022   Last thyroid functions Lab Results  Component Value Date    TSH 1.420 06/10/2022   Last vitamin D Lab Results  Component Value Date   VD25OH 35.5 06/21/2019       Objective     Today's Vitals   02/11/23 1517  BP: 106/73  Pulse: 85  SpO2: 98%  Weight: 135 lb 6.4 oz (61.4 kg)  Height: 5' 5.35" (1.66 m)   Body mass index is 22.29 kg/m.  BP Readings from Last 3 Encounters:  02/11/23 106/73  07/08/22 96/60  03/30/22 119/82    Wt Readings from Last 3 Encounters:  02/11/23 135 lb 6.4 oz (61.4 kg)  07/08/22 134 lb 1.9 oz (60.8 kg)  03/17/22 133 lb 6.4 oz (60.5 kg)    Physical Exam Vitals and nursing note reviewed.  Constitutional:      Appearance: Normal appearance. She is well-developed.  HENT:     Head: Normocephalic and atraumatic.     Nose: Nose normal.     Mouth/Throat:     Mouth: Mucous membranes are moist.     Pharynx: Oropharynx is clear.  Eyes:     Extraocular Movements: Extraocular movements intact.     Conjunctiva/sclera: Conjunctivae normal.     Pupils: Pupils are equal, round, and reactive to light.  Neck:     Vascular:  No carotid bruit.  Cardiovascular:     Rate and Rhythm: Normal rate and regular rhythm.     Pulses: Normal pulses.     Heart sounds: Normal heart sounds.  Pulmonary:     Effort: Pulmonary effort is normal.     Breath sounds: Normal breath sounds.  Abdominal:     Palpations: Abdomen is soft.  Musculoskeletal:        General: Normal range of motion.     Cervical back: Normal range of motion and neck supple.  Lymphadenopathy:     Cervical: No cervical adenopathy.  Skin:    General: Skin is warm and dry.     Capillary Refill: Capillary refill takes less than 2 seconds.  Neurological:     General: No focal deficit present.     Mental Status: She is alert and oriented to person, place, and time.  Psychiatric:        Mood and Affect: Mood normal.        Behavior: Behavior normal.        Thought Content: Thought content normal.        Judgment: Judgment normal.      Assessment & Plan     Generalized anxiety disorder Assessment & Plan: Trial wellbutrin XL 150 mg daily.  -reassess in 4 weeks   Orders: -     buPROPion HCl ER (XL); Take 1 tablet (150 mg total) by mouth daily.  Dispense: 30 tablet; Refill: 2  Current every day smoker Assessment & Plan: Trial Wellbutrin XL 150 mg daily to help with smoking cessation.  -reassess in 4 weeks   Orders: -     buPROPion HCl ER (XL); Take 1 tablet (150 mg total) by mouth daily.  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 needs lab appt for tomorrow. order already in epic.         Carlean Jews, NP  [facility] at [facility] [phone] (phone) [phone] (fax)  [facility]

## 2023-02-12 ENCOUNTER — Other Ambulatory Visit: Payer: BC Managed Care – PPO

## 2023-02-12 DIAGNOSIS — Z0184 Encounter for antibody response examination: Secondary | ICD-10-CM

## 2023-02-13 LAB — HEPATITIS B SURFACE ANTIBODY, QUANTITATIVE: Hepatitis B Surf Ab Quant: 15.3 m[IU]/mL (ref >9.9)

## 2023-03-11 ENCOUNTER — Ambulatory Visit: Payer: BC Managed Care – PPO | Admitting: Nurse Practitioner

## 2023-03-13 DIAGNOSIS — F17200 Nicotine dependence, unspecified, uncomplicated: Secondary | ICD-10-CM | POA: Insufficient documentation

## 2023-03-13 DIAGNOSIS — F411 Generalized anxiety disorder: Secondary | ICD-10-CM | POA: Insufficient documentation

## 2023-03-13 NOTE — Assessment & Plan Note (Addendum)
Trial wellbutrin XL 150 mg daily.  -reassess in 4 weeks

## 2023-03-13 NOTE — Assessment & Plan Note (Addendum)
Trial Wellbutrin XL 150 mg daily to help with smoking cessation.  -reassess in 4 weeks

## 2023-05-13 ENCOUNTER — Telehealth: Payer: Self-pay | Admitting: *Deleted

## 2023-05-13 NOTE — Telephone Encounter (Signed)
LVM for pt to call office about rescheduling an appointment.

## 2023-05-31 IMAGING — DX DG WRIST COMPLETE 3+V*R*
4 series · 4 of 4 positions shown · non-contrast
Comparison: None Available.

CLINICAL DATA: Pain after fall.

EXAM:
RIGHT WRIST - COMPLETE 3+ VIEW

[wrist pa]
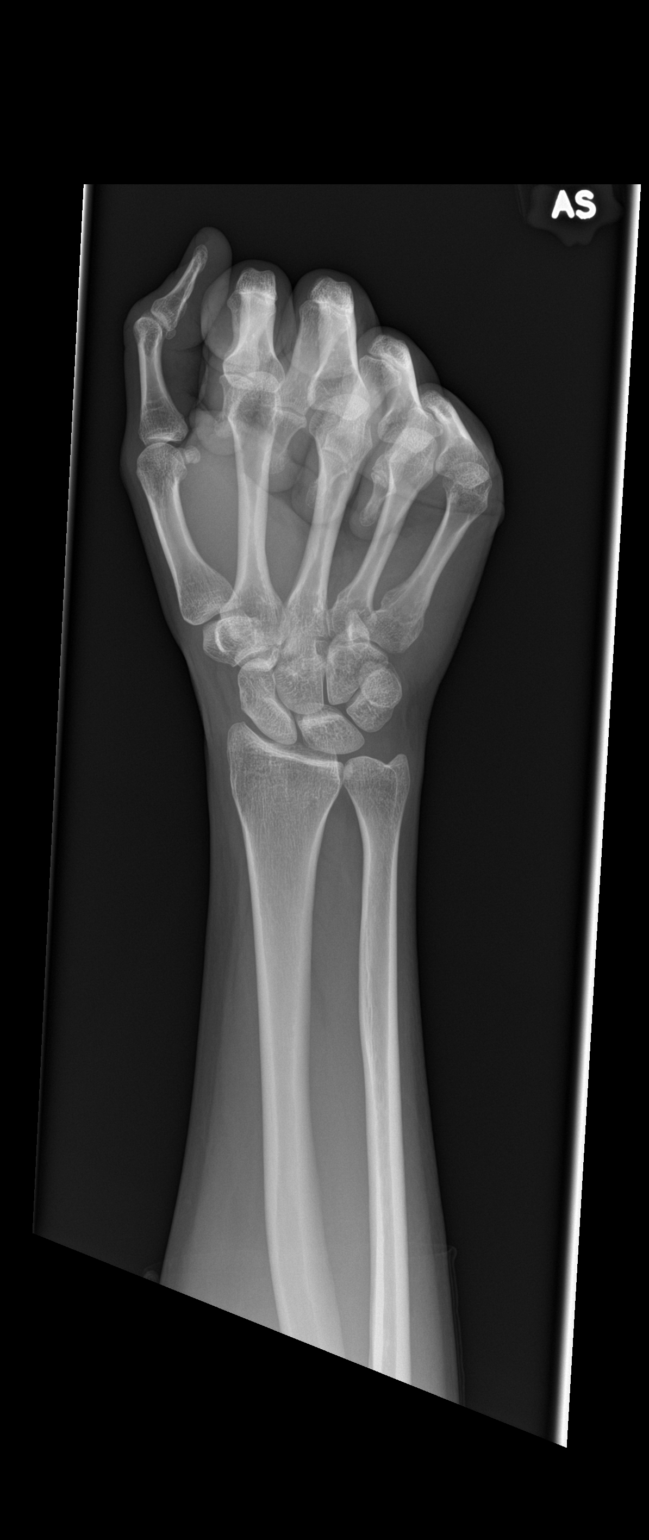

[wrist navicular]
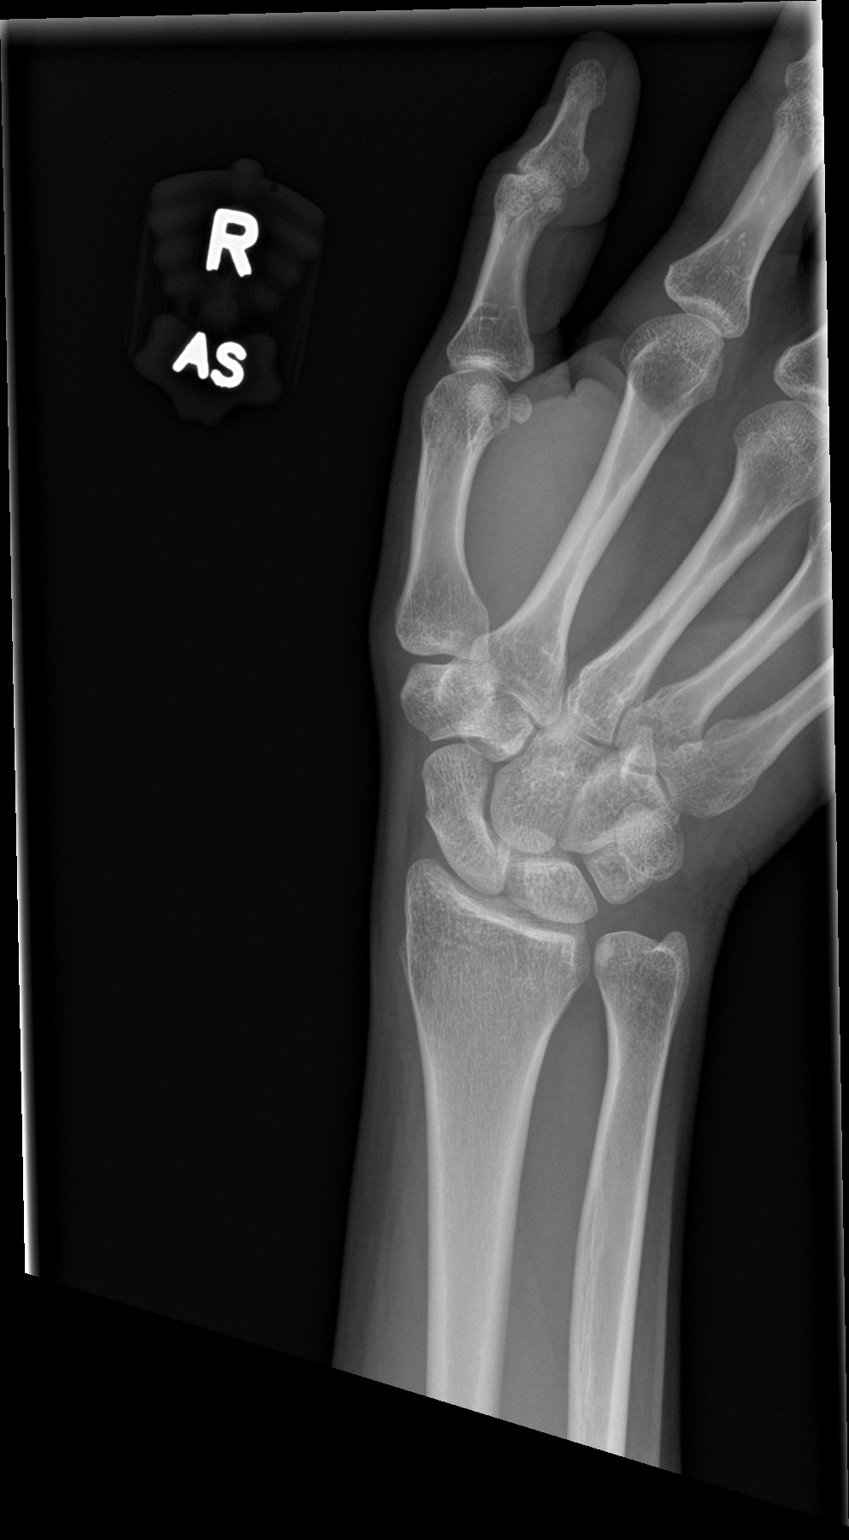

[wrist obl]
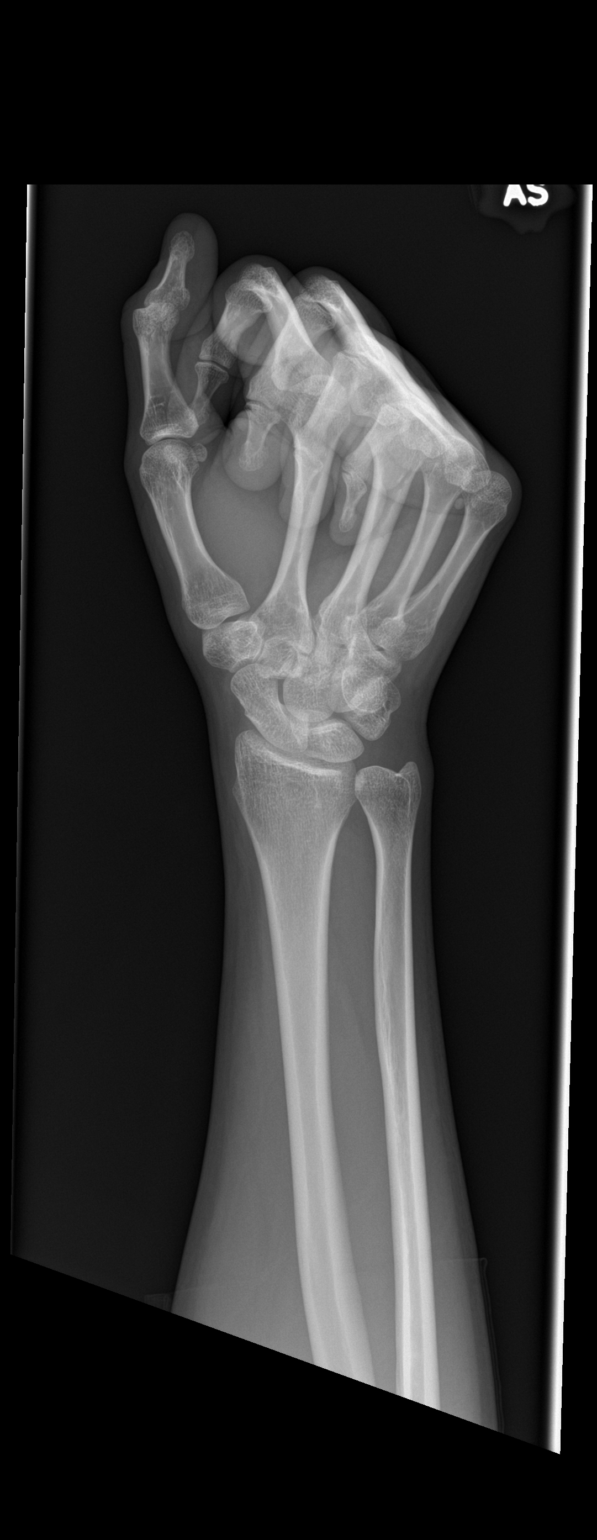

[wrist lat]
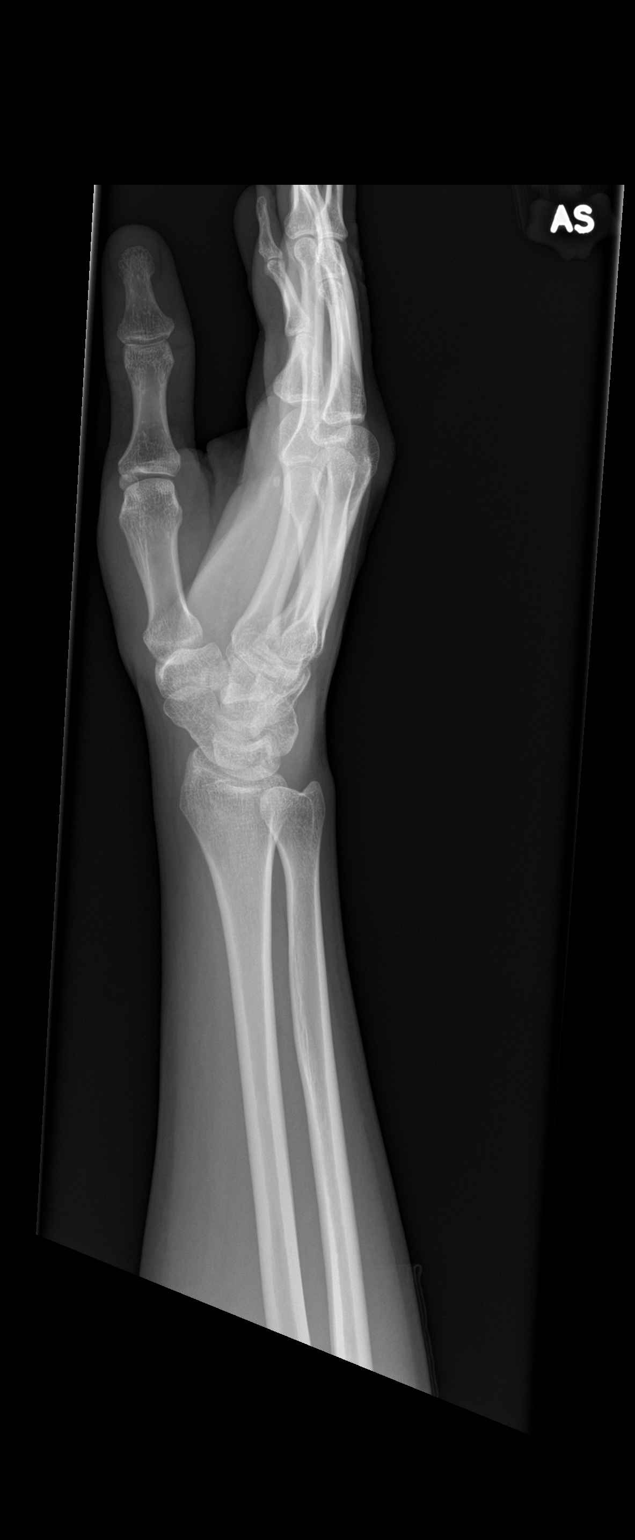

[4 of 4 positions shown; findings below may reference images not displayed]

FINDINGS: There is no evidence of fracture or dislocation. There is no
evidence of arthropathy or other focal bone abnormality. Soft
tissues are unremarkable.
IMPRESSION: Negative.

## 2023-07-31 ENCOUNTER — Other Ambulatory Visit: Payer: Self-pay | Admitting: Family Medicine

## 2023-07-31 DIAGNOSIS — Z0000 Encounter for general adult medical examination without abnormal findings: Secondary | ICD-10-CM

## 2023-08-05 ENCOUNTER — Other Ambulatory Visit: Payer: BC Managed Care – PPO

## 2023-08-05 DIAGNOSIS — Z0000 Encounter for general adult medical examination without abnormal findings: Secondary | ICD-10-CM

## 2023-08-06 LAB — COMPREHENSIVE METABOLIC PANEL
ALT: 15 IU/L (ref 0–32)
AST: 19 IU/L (ref 0–40)
Albumin: 4.5 g/dL (ref 3.9–4.9)
Alkaline Phosphatase: 46 IU/L (ref 44–121)
BUN/Creatinine Ratio: 11 (ref 9–23)
BUN: 8 mg/dL (ref 6–20)
Bilirubin Total: 0.4 mg/dL (ref 0.0–1.2)
CO2: 19 mmol/L — ABNORMAL LOW (ref 20–29)
Calcium: 8.8 mg/dL (ref 8.7–10.2)
Chloride: 106 mmol/L (ref 96–106)
Creatinine, Ser: 0.75 mg/dL (ref 0.57–1.00)
Globulin, Total: 2.1 g/dL (ref 1.5–4.5)
Glucose: 74 mg/dL (ref 70–99)
Potassium: 4.6 mmol/L (ref 3.5–5.2)
Sodium: 141 mmol/L (ref 134–144)
Total Protein: 6.6 g/dL (ref 6.0–8.5)
eGFR: 104 mL/min/{1.73_m2} (ref >59)

## 2023-08-06 LAB — CBC WITH DIFFERENTIAL/PLATELET
Basophils Absolute: 0 10*3/uL (ref 0.0–0.2)
Basos: 1 %
EOS (ABSOLUTE): 0.3 10*3/uL (ref 0.0–0.4)
Eos: 5 %
Hematocrit: 45.6 % (ref 34.0–46.6)
Hemoglobin: 15.2 g/dL (ref 11.1–15.9)
Immature Grans (Abs): 0 10*3/uL (ref 0.0–0.1)
Immature Granulocytes: 0 %
Lymphocytes Absolute: 1.9 10*3/uL (ref 0.7–3.1)
Lymphs: 42 %
MCH: 32.3 pg (ref 26.6–33.0)
MCHC: 33.3 g/dL (ref 31.5–35.7)
MCV: 97 fL (ref 79–97)
Monocytes Absolute: 0.6 10*3/uL (ref 0.1–0.9)
Monocytes: 13 %
Neutrophils Absolute: 1.8 10*3/uL (ref 1.4–7.0)
Neutrophils: 39 %
Platelets: 212 10*3/uL (ref 150–450)
RBC: 4.71 x10E6/uL (ref 3.77–5.28)
RDW: 12.4 % (ref 11.7–15.4)
WBC: 4.7 10*3/uL (ref 3.4–10.8)

## 2023-08-06 LAB — LIPID PANEL
Chol/HDL Ratio: 2.5 ratio (ref 0.0–4.4)
Cholesterol, Total: 148 mg/dL (ref 100–199)
HDL: 59 mg/dL (ref >39)
LDL Chol Calc (NIH): 69 mg/dL (ref 0–99)
Triglycerides: 111 mg/dL (ref 0–149)
VLDL Cholesterol Cal: 20 mg/dL (ref 5–40)

## 2023-08-06 LAB — HEMOGLOBIN A1C
Est. average glucose Bld gHb Est-mCnc: 103 mg/dL
Hgb A1c MFr Bld: 5.2 % (ref 4.8–5.6)

## 2023-08-14 ENCOUNTER — Encounter: Payer: Self-pay | Admitting: Family Medicine

## 2023-08-14 ENCOUNTER — Ambulatory Visit (INDEPENDENT_AMBULATORY_CARE_PROVIDER_SITE_OTHER): Payer: BC Managed Care – PPO | Admitting: Family Medicine

## 2023-08-14 VITALS — BP 113/77 | HR 89 | Ht 65.35 in | Wt 123.1 lb

## 2023-08-14 DIAGNOSIS — Z716 Tobacco abuse counseling: Secondary | ICD-10-CM | POA: Diagnosis not present

## 2023-08-14 DIAGNOSIS — Z0000 Encounter for general adult medical examination without abnormal findings: Secondary | ICD-10-CM | POA: Diagnosis not present

## 2023-08-14 NOTE — Assessment & Plan Note (Signed)
Patient weaned down to 1 cigarette/day at night before bed.  Recommended she try replacing the cigarette with nicotine chewing gum as it appears she still uses a cigarette for the calming effect of a nicotine.

## 2023-08-14 NOTE — Assessment & Plan Note (Signed)
>>  ASSESSMENT AND PLAN FOR TOBACCO ABUSE COUNSELING WRITTEN ON 08/14/2023 12:45 PM BY CHANDRA TORIBIO POUR, MD  Patient weaned down to 1 cigarette/day at night before bed.  Recommended she try replacing the cigarette with nicotine chewing gum as it appears she still uses a cigarette for the calming effect of a nicotine.

## 2023-08-14 NOTE — Patient Instructions (Signed)
It was nice to see you today,  We addressed the following topics today: -As an alternative to smoking 1 cigarette at night to relax, you can try chewing a piece of 2 mg nicotine gum.  This will help provide the relaxing effects you are desiring but not exposing you to carcinogens. - I will look into seeing if you can get an ultrasound of the breast without having to get a mammogram first.  I will send you a MyChart message regarding this when I find out more information. - Your lab tests are all normal.  Continue your current diet and exercise routine. - Congratulations on getting married, we will see you again for your next physical in a year.  Have a great day,  Frederic Jericho, MD

## 2023-08-14 NOTE — Assessment & Plan Note (Signed)
Patient healthy.  Abnormal weight and eats a diet low in saturated fat and carbohydrates.  Cholesterol tests were normal.  Discussed smoking cessation with patient.  Follow-up in 1 year.

## 2023-08-14 NOTE — Progress Notes (Signed)
   Annual physical  Subjective   Patient ID: Carolyn Lawrence, female    DOB: Jun 17, 1986  Age: 37 y.o. MRN: [id number]  Chief Complaint  Patient presents with   Annual Exam   HPI Carolyn Lawrence is a 37 y.o. old female here  for annual exam.   Changes in his/her health in the last 12 months: no  Patient currently works as a Physiological scientist at [employer].  She is married, just recently got married on September.  No children.  Does not use hormonal birth control, partner has vasectomy.  Patient does use tobacco, has 2 to 3 glasses of wine most evenings, no recreational drug use.  Patient eats a generally healthy diet.  She has a regarding that she eats fresh vegetables out of, also limits meat conception to 2 to 3 days a week.  Patient has weaned use of down to 1 cigarette a day, typically before she goes to bed at night to help her relax.  We discussed alternatives including nicotine gum at night before bed.  She had been taking bupropion to help her stop smoking but did not feel like this helped.  Took it for 2 months before stopping.  Patient has been getting mammograms for past 15 years.  Has fibrocystic changes in her breast and typically has to get ultrasounds after getting initial mammograms.  Patient gets her Pap smears here and is up-to-date.   The ASCVD Risk score (Arnett DK, et al., 2019) failed to calculate for the following reasons:   The 2019 ASCVD risk score is only valid for ages 7 to 86  Health Maintenance Due  Topic Date Due   HIV Screening  Never done   Hepatitis C Screening  Never done      Objective:     BP 113/77   Pulse 89   Ht 5' 5.35" (1.66 m)   Wt 123 lb 1.9 oz (55.8 kg)   LMP 07/31/2023   SpO2 98%   BMI 20.27 kg/m    Physical Exam General: Alert, oriented HEENT: PERRLA, EOMI, moist mucosa CV: Regular rate and rhythm no murmurs Pulmonary: Lungs clear bilaterally no wheeze or crackles GI: Soft, normal bowel sounds MSK: Strength of the bilaterally,  normal gait Extremities: No pedal edema.   No results found for any visits on 08/14/23.      Assessment & Plan:   Tobacco abuse counseling Assessment & Plan: Patient weaned down to 1 cigarette/day at night before bed.  Recommended she try replacing the cigarette with nicotine chewing gum as it appears she still uses a cigarette for the calming effect of a nicotine.   Healthcare maintenance Assessment & Plan: Patient healthy.  Abnormal weight and eats a diet low in saturated fat and carbohydrates.  Cholesterol tests were normal.  Discussed smoking cessation with patient.  Follow-up in 1 year.      Return in about 1 year (around 08/13/2024) for physical.    Sandre Kitty, MD

## 2023-09-17 ENCOUNTER — Telehealth: Payer: Self-pay

## 2023-09-17 DIAGNOSIS — Z1283 Encounter for screening for malignant neoplasm of skin: Secondary | ICD-10-CM

## 2023-09-17 NOTE — Telephone Encounter (Signed)
Pt LVM to get a new DERM referral. Pt states that she has seen DERM in the past but have to have a new referral due to her insurance. Pt is wanting to get skin check every year for mole removal.   Pt see Dr. Nita Sells but need a order each year for her insurance to pay

## 2023-09-17 NOTE — Telephone Encounter (Signed)
I have sent in the referral.

## 2023-09-17 NOTE — Addendum Note (Signed)
Addended by: Sandre Kitty on: 09/17/2023 04:25 PM   Modules accepted: Orders

## 2023-10-02 ENCOUNTER — Encounter: Payer: BC Managed Care – PPO | Admitting: Nurse Practitioner

## 2023-11-18 ENCOUNTER — Telehealth: Payer: Self-pay

## 2023-11-18 NOTE — Telephone Encounter (Signed)
 Copied from CRM [id number]. 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

## 2023-11-18 NOTE — Telephone Encounter (Signed)
 Please call the patient to let her know that I can provide a note stating she would need some additional time off but this does not necessarily mean they have to grant it to her.  She may want to look into seeing if this can be covered by [facility].  If she wanted to do this through FMLA I would need to have an appointment with her.  This could be in person or virtual.

## 2023-11-20 NOTE — Telephone Encounter (Signed)
 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

## 2023-11-20 NOTE — Telephone Encounter (Signed)
 I have sent her a note through MyChart.

## 2023-11-24 ENCOUNTER — Other Ambulatory Visit: Payer: Self-pay | Admitting: Family Medicine

## 2023-11-24 NOTE — Telephone Encounter (Signed)
 Copied from CRM [id number]. Topic: Clinical - Medication Refill >> Nov 24, 2023  3:33 PM Arlina R wrote: Most Recent Primary Care Visit:  Provider: CHANDRA TORIBIO POUR  Department: [facility]  Visit Type: PHYSICAL  Date: 08/14/2023  Medication: valACYclovir  (VALTREX ) 500 MG tablet  Has the patient contacted their pharmacy? Yes (Agent: If no, request that the patient contact the pharmacy for the refill. If patient does not wish to contact the pharmacy document the reason why and proceed with request.) (Agent: If yes, when and what did the pharmacy advise?)  Is this the correct pharmacy for this prescription? Yes If no, delete pharmacy and type the correct one.  This is the patient's preferred pharmacy:  [facility] [phone] [facility], [place] - [address] [place] [address] Phone: [phone] Fax: [phone]   Has the prescription been filled recently? No  Is the patient out of the medication? Yes  Has the patient been seen for an appointment in the last year OR does the patient have an upcoming appointment? Yes  Can we respond through MyChart? Yes  Agent: Please be advised that Rx refills may take up to 3 business days. We ask that you follow-up with your pharmacy.

## 2023-11-25 ENCOUNTER — Other Ambulatory Visit: Payer: Self-pay | Admitting: Family Medicine

## 2023-11-25 MED ORDER — VALACYCLOVIR HCL 500 MG PO TABS: 500.0000 mg | ORAL_TABLET | Freq: Two times a day (BID) | ORAL | 1 refills | Status: DC

## 2023-12-19 ENCOUNTER — Other Ambulatory Visit: Payer: Self-pay | Admitting: Nurse Practitioner

## 2023-12-19 DIAGNOSIS — N946 Dysmenorrhea, unspecified: Secondary | ICD-10-CM

## 2023-12-21 MED ORDER — IBUPROFEN 800 MG PO TABS: 800.0000 mg | ORAL_TABLET | Freq: Three times a day (TID) | ORAL | 0 refills | Status: AC

## 2024-01-12 ENCOUNTER — Other Ambulatory Visit: Payer: Self-pay | Admitting: Family Medicine

## 2024-01-12 ENCOUNTER — Telehealth: Payer: Self-pay

## 2024-01-12 DIAGNOSIS — Z1231 Encounter for screening mammogram for malignant neoplasm of breast: Secondary | ICD-10-CM

## 2024-01-12 NOTE — Telephone Encounter (Signed)
 I have sent in the mammogram order.

## 2024-01-12 NOTE — Telephone Encounter (Signed)
 Copied from CRM [id number]. Topic: Referral - Request for Referral >> Jan 12, 2024 12:46 PM Fuller Mandril wrote: Did the patient discuss referral with their provider in the last year? Yes (If No - schedule appointment) (If Yes - send message)  Appointment offered? No  Type of order/referral and detailed reason for visit: Mammogram as requested by provider. Per patient insurance requires referral.   Preference of office, provider, location: [facility] but no preference.    If referral order, have you been seen by this specialty before? Yes (If Yes, this issue or another issue? When? Where? [facility]   Can we respond through MyChart? Yes

## 2024-01-13 NOTE — Telephone Encounter (Signed)
 ok

## 2024-01-15 ENCOUNTER — Other Ambulatory Visit: Payer: Self-pay | Admitting: Family Medicine

## 2024-01-18 ENCOUNTER — Other Ambulatory Visit: Payer: Self-pay | Admitting: Family Medicine

## 2024-01-18 DIAGNOSIS — D242 Benign neoplasm of left breast: Secondary | ICD-10-CM

## 2024-01-21 ENCOUNTER — Other Ambulatory Visit: Payer: Self-pay | Admitting: Family Medicine

## 2024-01-21 DIAGNOSIS — D242 Benign neoplasm of left breast: Secondary | ICD-10-CM

## 2024-01-27 ENCOUNTER — Ambulatory Visit
Admission: RE | Admit: 2024-01-27 | Discharge: 2024-01-27 | Disposition: A | Discharge status: Home / Self Care | Payer: Self-pay | Source: Ambulatory Visit | Attending: Family Medicine | Admitting: Family Medicine

## 2024-01-27 ENCOUNTER — Ambulatory Visit: Payer: Self-pay

## 2024-01-27 DIAGNOSIS — D242 Benign neoplasm of left breast: Secondary | ICD-10-CM

## 2024-02-04 ENCOUNTER — Other Ambulatory Visit: Payer: Self-pay | Admitting: *Deleted

## 2024-02-04 ENCOUNTER — Telehealth: Payer: Self-pay | Admitting: *Deleted

## 2024-02-04 DIAGNOSIS — Z111 Encounter for screening for respiratory tuberculosis: Secondary | ICD-10-CM

## 2024-02-04 NOTE — Telephone Encounter (Signed)
 Appt scheduled

## 2024-02-04 NOTE — Telephone Encounter (Signed)
 Copied from CRM [id number]. Topic: Clinical - Request for Lab/Test Order >> Feb 04, 2024  2:01 PM Izetta Dakin wrote: Reason for CRM: Patient requesting lab work.

## 2024-02-07 ENCOUNTER — Other Ambulatory Visit: Payer: Self-pay | Admitting: Family Medicine

## 2024-02-09 ENCOUNTER — Other Ambulatory Visit

## 2024-02-13 LAB — QUANTIFERON-TB GOLD PLUS
QuantiFERON Mitogen Value: >10 [IU]/mL
QuantiFERON Nil Value: 0.05 [IU]/mL
QuantiFERON TB1 Ag Value: 0.26 [IU]/mL
QuantiFERON TB2 Ag Value: 0.34 [IU]/mL
QuantiFERON-TB Gold Plus: NEGATIVE

## 2024-02-15 ENCOUNTER — Encounter: Payer: Self-pay | Admitting: Family Medicine

## 2024-03-14 ENCOUNTER — Ambulatory Visit: Payer: Self-pay

## 2024-03-14 NOTE — Telephone Encounter (Signed)
 Copied from CRM [id number]. Topic: Clinical - Red Word Triage >> Mar 14, 2024 10:12 AM Donald Frost wrote: Red Word that prompted transfer to Nurse Triage: The patient states she has been sick for a few weeks but since yesterday it got worse. She states she is struggling with shortness of breath and having to use her inhaler a lot more than she should have to. She also states she has had a deep cough and occasionally gets up some thick mucus. I will transfer her to E2C2 NT  Chief Complaint: shortness of breath, wheezing Symptoms: cough, sob, wheezing Frequency: constant Pertinent Negatives: Patient denies fever, cp Disposition: [] ED /[x] Urgent Care (no appt availability in office) / [] Appointment(In office/virtual)/ []  [facility]/ [] Home Care/ [] Refused Recommended Disposition /[] [facility]/ []  Follow-up with PCP Additional Notes: per protocol instructed to go to UC.  Care advice given, denies questions; instructed to go to ER if becomes worse.   Reason for Disposition  [1] MILD difficulty breathing (e.g., minimal/no SOB at rest, SOB with walking, pulse <100) AND [2] NEW-onset or WORSE than normal  Answer Assessment - Initial Assessment Questions 1. RESPIRATORY STATUS: "Describe your breathing?" (e.g., wheezing, shortness of breath, unable to speak, severe coughing)      Shortness of breath, wheezy 2. ONSET: "When did this breathing problem begin?"      Two weeks ago 3. PATTERN "Does the difficult breathing come and go, or has it been constant since it started?"      Comes and goes 4. SEVERITY: "How bad is your breathing?" (e.g., mild, moderate, severe)    - MILD: No SOB at rest, mild SOB with walking, speaks normally in sentences, can lie down, no retractions, pulse < 100.    - MODERATE: SOB at rest, SOB with minimal exertion and prefers to sit, cannot lie down flat, speaks in phrases, mild retractions, audible wheezing, pulse 100-120.    - SEVERE: Very SOB at rest, speaks  in single words, struggling to breathe, sitting hunched forward, retractions, pulse > 120      moderate 5. RECURRENT SYMPTOM: "Have you had difficulty breathing before?" If Yes, ask: "When was the last time?" and "What happened that time?"      yes 6. CARDIAC HISTORY: "Do you have any history of heart disease?" (e.g., heart attack, angina, bypass surgery, angioplasty)      no 7. LUNG HISTORY: "Do you have any history of lung disease?"  (e.g., pulmonary embolus, asthma, emphysema)     no 8. CAUSE: "What do you think is causing the breathing problem?"      unknown 9. OTHER SYMPTOMS: "Do you have any other symptoms? (e.g., dizziness, runny nose, cough, chest pain, fever)     cough 10. O2 SATURATION MONITOR:  "Do you use an oxygen saturation monitor (pulse oximeter) at home?" If Yes, ask: "What is your reading (oxygen level) today?" "What is your usual oxygen saturation reading?" (e.g., 95%)       no 11. PREGNANCY: "Is there any chance you are pregnant?" "When was your last menstrual period?"       no 12. TRAVEL: "Have you traveled out of the country in the last month?" (e.g., travel history, exposures)       no  Protocols used: Breathing Difficulty-A-AH

## 2024-03-18 ENCOUNTER — Encounter: Payer: Self-pay | Admitting: Family Medicine

## 2024-03-18 ENCOUNTER — Ambulatory Visit: Admitting: Family Medicine

## 2024-03-18 VITALS — BP 112/80 | HR 97 | Ht 65.35 in | Wt 124.8 lb

## 2024-03-18 DIAGNOSIS — J3089 Other allergic rhinitis: Secondary | ICD-10-CM | POA: Diagnosis not present

## 2024-03-18 DIAGNOSIS — J4541 Moderate persistent asthma with (acute) exacerbation: Secondary | ICD-10-CM | POA: Insufficient documentation

## 2024-03-18 MED ORDER — FLUTICASONE PROPIONATE 50 MCG/ACT NA SUSP: 2.0000 | Freq: Every day | NASAL | 6 refills | Status: DC

## 2024-03-18 MED ORDER — AIRSUPRA 90-80 MCG/ACT IN AERO: 2.0000 | INHALATION_SPRAY | RESPIRATORY_TRACT | 3 refills | Status: DC | PRN

## 2024-03-18 MED ORDER — VALACYCLOVIR HCL 500 MG PO TABS: 500.0000 mg | ORAL_TABLET | Freq: Two times a day (BID) | ORAL | 1 refills | Status: DC

## 2024-03-18 NOTE — Assessment & Plan Note (Signed)
 Continue xyzal.  Adding flonase.  Allergy referral placed.

## 2024-03-18 NOTE — Assessment & Plan Note (Signed)
 Still with cough and mild b/l wheezing.  Continue steroid treatment for exacerbation. Continue symbicort bid.  Adding airsupra in place of albuterol .  Adding flonase to xyzal for control of allergies.  Referral to allergy/asthma placed.

## 2024-03-18 NOTE — Patient Instructions (Signed)
 It was nice to see you today,  We addressed the following topics today: -I have sent in Airsupra.  Uses instead of the albuterol  rescue inhaler - Continue to use the Symbicort.  You can look for videos on YouTube provided by the manufacturer or physicians on how to properly use inhalers. - Continue using your steroid and let us  know if it is not better after the seventh day - I am sending in a referral to the allergy and asthma specialist. - I have sent in Flonase for you to use as well.  This is a nasal spray to use daily during allergy season.  Have a great day,  Etha Henle, MD

## 2024-03-18 NOTE — Progress Notes (Signed)
   Acute Office Visit  Subjective:     Patient ID: Carolyn Lawrence, female    DOB: [birth year], 38 y.o.   MRN: [id number]  Chief Complaint  Patient presents with   flu-like symptoms    SOB, cough, wheezing     HPI Asthma exacerbation-patient has never been diagnosed with asthma but does have worsening seasonal allergies and this year has had wheezing and cough for the past several weeks. Aaron Aas  Has seen urgent care recently and is on albuterol , Symbicort and prednisone  x 7 days.  We discussed alternatives including switching to Airsupra, referring to allergy and asthma, adding Flonase to her Xyzal.  Had a distant history of late eczema around the eyes but this only happened 1 time and resolved without treatment.  No longer smoking except on rare occasions.   ROS      Objective:    BP 112/80   Pulse 97   Ht 5' 5.35" (1.66 m)   Wt 124 lb 12.8 oz (56.6 kg)   LMP 03/10/2024   SpO2 97%   BMI 20.55 kg/m    Physical Exam General: Alert, oriented CV: Regular Pulmonary: Bilateral wheezes.  No respiratory distress.  No results found for any visits on 03/18/24.      Assessment & Plan:   Perennial allergic rhinitis Assessment & Plan: Continue xyzal.  Adding flonase.  Allergy referral placed.   Orders: -     Ambulatory referral to Allergy  Moderate persistent asthma with acute exacerbation Assessment & Plan: Still with cough and mild b/l wheezing.  Continue steroid treatment for exacerbation. Continue symbicort bid.  Adding airsupra in place of albuterol .  Adding flonase to xyzal for control of allergies.  Referral to allergy/asthma placed.   Orders: -     Ambulatory referral to Allergy  Other orders -     valACYclovir  HCl; Take 1 tablet (500 mg total) by mouth 2 (two) times daily.  Dispense: 60 tablet; Refill: 1 -     Airsupra; Inhale 2 puffs into the lungs every 4 (four) hours as needed.  Dispense: 21.4 g; Refill: 3 -     Fluticasone Propionate; Place 2 sprays into  both nostrils daily.  Dispense: 16 g; Refill: 6     Return if symptoms worsen or fail to improve.  Laneta Pintos, MD

## 2024-05-11 ENCOUNTER — Other Ambulatory Visit: Payer: Self-pay | Admitting: Family Medicine

## 2024-05-11 DIAGNOSIS — J209 Acute bronchitis, unspecified: Secondary | ICD-10-CM

## 2024-05-22 NOTE — Progress Notes (Unsigned)
 New Patient Note  RE: Carolyn Lawrence MRN: [id number] DOB: 05/16/1986 Date of Office Visit: 05/23/2024  Consult requested by: Chandra Toribio POUR, MD Primary care provider: Chandra Toribio POUR, MD  Chief Complaint: No chief complaint on file.  History of Present Illness: I had the pleasure of seeing Carolyn Lawrence for initial evaluation at the [facility] on 05/23/2024. She is a 38 y.o. female, who is referred here by Chandra Toribio POUR, MD for the evaluation of asthma and allergies.  Discussed the use of AI scribe software for clinical note transcription with the patient, who gave verbal consent to proceed.  History of Present Illness             ***  Assessment and Plan: Carolyn Lawrence is a 38 y.o. female with: ***  Assessment and Plan               No follow-ups on file.  No orders of the defined types were placed in this encounter.  Lab Orders  No laboratory test(s) ordered today    Other allergy screening: Asthma: {Blank single:19197::yes,no} Rhino conjunctivitis: {Blank single:19197::yes,no} Food allergy: {Blank single:19197::yes,no} Medication allergy: {Blank single:19197::yes,no} Hymenoptera allergy: {Blank single:19197::yes,no} Urticaria: {Blank single:19197::yes,no} Eczema:{Blank single:19197::yes,no} History of recurrent infections suggestive of immunodeficency: {Blank single:19197::yes,no}  Diagnostics: Spirometry:  Tracings reviewed. Her effort: {Blank single:19197::Good reproducible efforts.,It was hard to get consistent efforts and there is a question as to whether this reflects a maximal maneuver.,Poor effort, data can not be interpreted.} FVC: ***L FEV1: ***L, ***% predicted FEV1/FVC ratio: ***% Interpretation: {Blank single:19197::Spirometry consistent with mild obstructive disease,Spirometry consistent with moderate obstructive disease,Spirometry consistent with severe obstructive  disease,Spirometry consistent with possible restrictive disease,Spirometry consistent with mixed obstructive and restrictive disease,Spirometry uninterpretable due to technique,Spirometry consistent with normal pattern,No overt abnormalities noted given today's efforts}.  Please see scanned spirometry results for details.  Skin Testing: {Blank single:19197::Select foods,Environmental allergy panel,Environmental allergy panel and select foods,Food allergy panel,None,Deferred due to recent antihistamines use}. *** Results discussed with patient/family.   Past Medical History: Patient Active Problem List   Diagnosis Date Noted  . Moderate persistent asthma with acute exacerbation 03/18/2024  . Healthcare maintenance 08/14/2023  . Generalized anxiety disorder 03/13/2023  . Current every day smoker 03/13/2023  . 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 History of abnormal cervical Pap smear 07/27/2022  . Menstrual cramps 03/17/2022  . Family history of breast cancer 06/21/2019  . Cervical high risk HPV (human papillomavirus) test positive- 04/2014 06/22/2018  . Vitamin D  deficiency 06/22/2018  . Fibrocystic breast changes, bilateral 06/22/2018  . 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 HSV infection 06/22/2018  . Perennial allergic rhinitis 06/07/2018  . Tobacco use disorder, continuous 06/07/2018  . Tobacco abuse counseling 06/07/2018  . Mild Exp Wheezes 06/07/2018   Past Medical History:  Diagnosis Date  . HSV infection    Past Surgical History: Past Surgical History:  Procedure Laterality Date  . LEEP    . WISDOM TOOTH EXTRACTION     Medication List:  Current Outpatient Medications  Medication Sig Dispense Refill  . Albuterol -Budesonide (AIRSUPRA ) 90-80 MCG/ACT AERO Inhale 2 puffs into the lungs every 4 (four) hours as needed. 21.4 g 3  . budesonide-formoterol (SYMBICORT) 160-4.5 MCG/ACT inhaler INHALE 2 PUFFS IN  THE MORNING AND 2 PUFFS BEFORE BEDTIME. RINSE MOUTH WITH WATER AFTER USE TO REDUCE AFTERTASTE AND INCIDENCE OF CANDIDIASIS. DO NOT SWALLOW.. 10.2 each 5  . fluticasone  (FLONASE ) 50 MCG/ACT  nasal spray Place 2 sprays into both nostrils daily. 16 g 6  . ibuprofen  (ADVIL ) 800 MG tablet Take 1 tablet (800 mg total) by mouth 3 (three) times daily. Office visit needed for refills 270 tablet 0  . valACYclovir  (VALTREX ) 500 MG tablet Take 1 tablet (500 mg total) by mouth 2 (two) times daily. 60 tablet 1   No current facility-administered medications for this visit.   Allergies: No Known Allergies Social History: Social History   Socioeconomic History  . Marital status: Single    Spouse name: Not on file  . Number of children: Not on file  . Years of education: Not on file  . Highest education level: Not on file  Occupational History  . Not on file  Tobacco Use  . Smoking status: Some Days    Current packs/day: 0.25    Average packs/day: 0.3 packs/day for 15.0 years (3.8 ttl pk-yrs)    Types: Cigarettes  . Smokeless tobacco: Never  . Tobacco comments:    occassionally   Vaping Use  . Vaping status: Never Used  Substance and Sexual Activity  . Alcohol use: Yes    Alcohol/week: 10.0 - 15.0 standard drinks of alcohol    Types: 10 - 15 Standard drinks or equivalent per week  . Drug use: No  . Sexual activity: Yes    Birth control/protection: None  Other Topics Concern  . Not on file  Social History Narrative  . Not on file   Social Drivers of Health   Financial Resource Strain: Not on file  Food Insecurity: Not on file  Transportation Needs: Not on file  Physical Activity: Not on file  Stress: Not on file  Social Connections: Not on file   Lives in a ***. Smoking: *** Occupation: ***  Environmental HistorySurveyor, minerals in the house: Network engineer in the family room: {Blank single:19197::yes,no} Carpet in the bedroom: {Blank  single:19197::yes,no} Heating: {Blank single:19197::electric,gas,heat pump} Cooling: {Blank single:19197::central,window,heat pump} Pet: {Blank single:19197::yes ***,no}  Family History: Family History  Problem Relation Age of Onset  . Hypertension Mother   . Hyperlipidemia Mother   . Alcoholism Mother   . Hyperlipidemia Father   . Hypertension Father   . Alcoholism Sister   . High Cholesterol Maternal Grandmother   . Breast cancer Maternal Grandmother   . Diabetes Paternal Grandmother   . High Cholesterol Paternal Grandmother   . Stroke Paternal Grandmother   . Heart attack Paternal Grandfather   . High Cholesterol Paternal Grandfather   . Cancer Other    Problem                               Relation Asthma                                   *** Eczema                                *** Food allergy                          *** Allergic rhino conjunctivitis     ***  Review of Systems  Constitutional:  Negative for appetite change, chills, fever and unexpected weight change.  HENT:  Negative for congestion and rhinorrhea.  Eyes:  Negative for itching.  Respiratory:  Negative for cough, chest tightness, shortness of breath and wheezing.   Cardiovascular:  Negative for chest pain.  Gastrointestinal:  Negative for abdominal pain.  Genitourinary:  Negative for difficulty urinating.  Skin:  Negative for rash.  Neurological:  Negative for headaches.    Objective: There were no vitals taken for this visit. There is no height or weight on file to calculate BMI. Physical Exam Vitals and nursing note reviewed.  Constitutional:      Appearance: Normal appearance. She is well-developed.  HENT:     Head: Normocephalic and atraumatic.     Right Ear: Tympanic membrane and external ear normal.     Left Ear: Tympanic membrane and external ear normal.     Nose: Nose normal.     Mouth/Throat:     Mouth: Mucous membranes are moist.     Pharynx: Oropharynx is  clear.  Eyes:     Conjunctiva/sclera: Conjunctivae normal.  Cardiovascular:     Rate and Rhythm: Normal rate and regular rhythm.     Heart sounds: Normal heart sounds. No murmur heard.    No friction rub. No gallop.  Pulmonary:     Effort: Pulmonary effort is normal.     Breath sounds: Normal breath sounds. No wheezing, rhonchi or rales.  Musculoskeletal:     Cervical back: Neck supple.  Skin:    General: Skin is warm.     Findings: No rash.  Neurological:     Mental Status: She is alert and oriented to person, place, and time.  Psychiatric:        Behavior: Behavior normal.   The plan was reviewed with the patient/family, and all questions/concerned were addressed.  It was my pleasure to see Carolyn Lawrence today and participate in her care. Please feel free to contact me with any questions or concerns.  Sincerely,  Orlan Cramp, DO Allergy & Immunology  [facility]  [facility] office: [phone] [facility] office: [phone]

## 2024-05-23 ENCOUNTER — Other Ambulatory Visit: Payer: Self-pay

## 2024-05-23 ENCOUNTER — Encounter: Payer: Self-pay | Admitting: Allergy

## 2024-05-23 ENCOUNTER — Ambulatory Visit (INDEPENDENT_AMBULATORY_CARE_PROVIDER_SITE_OTHER): Payer: Self-pay | Admitting: Allergy

## 2024-05-23 VITALS — BP 112/88 | HR 82 | Temp 98.3°F | Resp 16 | Ht 65.35 in | Wt 128.9 lb

## 2024-05-23 DIAGNOSIS — J4540 Moderate persistent asthma, uncomplicated: Secondary | ICD-10-CM

## 2024-05-23 DIAGNOSIS — J3089 Other allergic rhinitis: Secondary | ICD-10-CM

## 2024-05-23 NOTE — Patient Instructions (Addendum)
 Asthma Daily controller medication(s): Symbicort 160mcg 2 puffs once a day and rinse mouth after each use. During respiratory infections/flares:  Start Symbicort 160mcg 2 puffs twice a day and rinse mouth after each use for 1-2 weeks until your breathing symptoms return to baseline.  May use albuterol  rescue inhaler 2 puffs every 4 to 6 hours as needed for shortness of breath, chest tightness, coughing, and wheezing. May use albuterol  rescue inhaler 2 puffs 5 to 15 minutes prior to strenuous physical activities. Monitor frequency of use - if you need to use it more than twice per week on a consistent basis let us  know.  Breathing control goals:  Full participation in all desired activities (may need albuterol  before activity) Albuterol  use two times or less a week on average (not counting use with activity) Cough interfering with sleep two times or less a month Oral steroids no more than once a year No hospitalizations   Rhinitis  Return for allergy skin testing. Will make additional recommendations based on results. If significant positives will recommend allergy injections - handout given. Make sure you don't take any antihistamines for 3 days before the skin testing appointment. Don't put any lotion on the back and arms on the day of testing.  Must be in good health and not ill. No vaccines/injections/antibiotics within the past 7 days.  Plan on being here for 30-60 minutes.  Nasal saline spray (i.e., Simply Saline) or nasal saline lavage (i.e., NeilMed) is recommended as needed and prior to medicated nasal sprays.  Follow up for skin testing.

## 2024-05-24 ENCOUNTER — Other Ambulatory Visit: Payer: Self-pay | Admitting: Family Medicine

## 2024-05-24 ENCOUNTER — Encounter: Payer: Self-pay | Admitting: Family Medicine

## 2024-05-24 DIAGNOSIS — J3089 Other allergic rhinitis: Secondary | ICD-10-CM

## 2024-05-30 ENCOUNTER — Encounter: Payer: Self-pay | Admitting: Allergy

## 2024-05-30 ENCOUNTER — Ambulatory Visit: Admitting: Allergy

## 2024-05-30 DIAGNOSIS — J3089 Other allergic rhinitis: Secondary | ICD-10-CM | POA: Diagnosis not present

## 2024-05-30 DIAGNOSIS — J4540 Moderate persistent asthma, uncomplicated: Secondary | ICD-10-CM

## 2024-05-30 DIAGNOSIS — H1013 Acute atopic conjunctivitis, bilateral: Secondary | ICD-10-CM

## 2024-05-30 NOTE — Patient Instructions (Addendum)
 Today's skin testing positive to grass, ragweed, weed, trees, mold, dog, horse. Borderline to dust mites.   Results given.  Environmental allergies Start environmental control measures as below. Use over the counter antihistamines such as Zyrtec (cetirizine), Claritin (loratadine), Allegra (fexofenadine ), or Xyzal (levocetirizine) daily as needed. May take twice a day during allergy  flares. May switch antihistamines every few months. Recommend allergy  injections. 2 injections.  Had a detailed discussion with patient/family that clinical history is suggestive of allergic rhinitis, and may benefit from allergy  immunotherapy (AIT). Discussed in detail regarding the dosing, schedule, side effects (mild to moderate local allergic reaction and rarely systemic allergic reactions including anaphylaxis), and benefits (significant improvement in nasal symptoms, seasonal flares of asthma) of immunotherapy with the patient. There is significant time commitment involved with allergy  shots, which includes weekly immunotherapy injections for first 9-12 months and then biweekly to monthly injections for 3-5 years.   Asthma Daily controller medication(s): Symbicort 160mcg 2 puffs once a day and rinse mouth after each use. During respiratory infections/flares:  Start Symbicort 160mcg 2 puffs twice a day and rinse mouth after each use for 1-2 weeks until your breathing symptoms return to baseline.  May use albuterol  rescue inhaler 2 puffs every 4 to 6 hours as needed for shortness of breath, chest tightness, coughing, and wheezing. May use albuterol  rescue inhaler 2 puffs 5 to 15 minutes prior to strenuous physical activities. Monitor frequency of use - if you need to use it more than twice per week on a consistent basis let us  know.  Breathing control goals:  Full participation in all desired activities (may need albuterol  before activity) Albuterol  use two times or less a week on average (not counting use with  activity) Cough interfering with sleep two times or less a month Oral steroids no more than once a year No hospitalizations   Return in about 6 months (around 11/30/2024). Or sooner if needed.   Reducing Pollen Exposure Pollen seasons: trees (spring), grass (summer) and ragweed/weeds (fall). Keep windows closed in your home and car to lower pollen exposure.  Install air conditioning in the bedroom and throughout the house if possible.  Avoid going out in dry windy days - especially early morning. Pollen counts are highest between 5 - 10 AM and on dry, hot and windy days.  Save outside activities for late afternoon or after a heavy rain, when pollen levels are lower.  Avoid mowing of grass if you have grass pollen allergy . Be aware that pollen can also be transported indoors on people and pets.  Dry your clothes in an automatic dryer rather than hanging them outside where they might collect pollen.  Rinse hair and eyes before bedtime. Mold Control Mold and fungi can grow on a variety of surfaces provided certain temperature and moisture conditions exist.  Outdoor molds grow on plants, decaying vegetation and soil. The major outdoor mold, Alternaria and Cladosporium, are found in very high numbers during hot and dry conditions. Generally, a late summer - fall peak is seen for common outdoor fungal spores. Rain will temporarily lower outdoor mold spore count, but counts rise rapidly when the rainy period ends. The most important indoor molds are Aspergillus and Penicillium. Dark, humid and poorly ventilated basements are ideal sites for mold growth. The next most common sites of mold growth are the bathroom and the kitchen. Outdoor (Seasonal) Mold Control Use air conditioning and keep windows closed. Avoid exposure to decaying vegetation. Avoid leaf raking. Avoid grain handling. Consider wearing a  face mask if working in moldy areas.  Indoor (Perennial) Mold Control  Maintain humidity below  50%. Get rid of mold growth on hard surfaces with water, detergent and, if necessary, 5% bleach (do not mix with other cleaners). Then dry the area completely. If mold covers an area more than 10 square feet, consider hiring an indoor environmental professional. For clothing, washing with soap and water is best. If moldy items cannot be cleaned and dried, throw them away. Remove sources e.g. contaminated carpets. Repair and seal leaking roofs or pipes. Using dehumidifiers in damp basements may be helpful, but empty the water and clean units regularly to prevent mildew from forming. All rooms, especially basements, bathrooms and kitchens, require ventilation and cleaning to deter mold and mildew growth. Avoid carpeting on concrete or damp floors, and storing items in damp areas. Control of House Dust Mite Allergen Dust mite allergens are a common trigger of allergy  and asthma symptoms. While they can be found throughout the house, these microscopic creatures thrive in warm, humid environments such as bedding, upholstered furniture and carpeting. Because so much time is spent in the bedroom, it is essential to reduce mite levels there.  Encase pillows, mattresses, and box springs in special allergen-proof fabric covers or airtight, zippered plastic covers.  Bedding should be washed weekly in hot water (130 F) and dried in a hot dryer. Allergen-proof covers are available for comforters and pillows that can't be regularly washed.  Wash the allergy -proof covers every few months. Minimize clutter in the bedroom. Keep pets out of the bedroom.  Keep humidity less than 50% by using a dehumidifier or air conditioning. You can buy a humidity measuring device called a hygrometer to monitor this.  If possible, replace carpets with hardwood, linoleum, or washable area rugs. If that's not possible, vacuum frequently with a vacuum that has a HEPA filter. Remove all upholstered furniture and non-washable window drapes  from the bedroom. Remove all non-washable stuffed toys from the bedroom.  Wash stuffed toys weekly. Pet Allergen Avoidance: Contrary to popular opinion, there are no "hypoallergenic" breeds of dogs or cats. That is because people are not allergic to an animal's hair, but to an allergen found in the animal's saliva, dander (dead skin flakes) or urine. Pet allergy  symptoms typically occur within minutes. For some people, symptoms can build up and become most severe 8 to 12 hours after contact with the animal. People with severe allergies can experience reactions in public places if dander has been transported on the pet owners' clothing. Keeping an animal outdoors is only a partial solution, since homes with pets in the yard still have higher concentrations of animal allergens. Before getting a pet, ask your allergist to determine if you are allergic to animals. If your pet is already considered part of your family, try to minimize contact and keep the pet out of the bedroom and other rooms where you spend a great deal of time. As with dust mites, vacuum carpets often or replace carpet with a hardwood floor, tile or linoleum. High-efficiency particulate air (HEPA) cleaners can reduce allergen levels over time. While dander and saliva are the source of cat and dog allergens, urine is the source of allergens from rabbits, hamsters, mice and israel pigs; so ask a non-allergic family member to clean the animal's cage. If you have a pet allergy , talk to your allergist about the potential for allergy  immunotherapy (allergy  shots). This strategy can often provide long-term relief.

## 2024-05-30 NOTE — Progress Notes (Signed)
 Skin testing note  RE: Carolyn Lawrence MRN: [id number] DOB: 01-08-1986 Date of Office Visit: 05/30/2024  Referring provider: Chandra Toribio POUR, MD Primary care provider: Chandra Toribio POUR, MD  Chief Complaint: skin testing  History of Present Illness: I had the pleasure of seeing Carolyn Lawrence for a skin testing visit at the [facility] on 05/30/2024. She is a 38 y.o. female, who is being followed for allergic rhinitis, asthma. Her previous allergy  office visit was on 05/23/2024 with Dr. Luke. Today is a skin testing visit.   Discussed the use of AI scribe software for clinical note transcription with the patient, who gave verbal consent to proceed.    She has tested positive for multiple allergens including pollen, trees, grass, weed, ragweed, mold, dog, horse, and dust mites.   She lives on a small property with goats and gardens, which she does not consider a farm. She is curious to see if her symptoms improve in the fall, as most of her allergies are associated with springtime.  She is well-stocked on her medications and does not require refills at this time.     Assessment and Plan: Carolyn Lawrence is a 38 y.o. female with: Other allergic rhinitis Allergic conjunctivitis of both eyes Past history - symptoms of itchy, watery eyes, and runny nose during high pollen seasons, likely exacerbated by exposure to livestock and hay. Managed with Xyzal, but not used recently. Today's skin testing positive to grass, ragweed, weed, trees, mold, dog, horse. Borderline to dust mites.  Start environmental control measures as below. Use over the counter antihistamines such as Zyrtec (cetirizine), Claritin (loratadine), Allegra (fexofenadine ), or Xyzal (levocetirizine) daily as needed. May take twice a day during allergy  flares. May switch antihistamines every few months. Recommend allergy  injections. 2 injections.  Had a detailed discussion with patient/family that clinical history is  suggestive of allergic rhinitis, and may benefit from allergy  immunotherapy (AIT). Discussed in detail regarding the dosing, schedule, side effects (mild to moderate local allergic reaction and rarely systemic allergic reactions including anaphylaxis), and benefits (significant improvement in nasal symptoms, seasonal flares of asthma) of immunotherapy with the patient. There is significant time commitment involved with allergy  shots, which includes weekly immunotherapy injections for first 9-12 months and then biweekly to monthly injections for 3-5 years.   Moderate persistent asthma without complication Past history - exacerbated by allergens, primarily during spring and high pollen seasons. Symptoms improved with Symbicort and albuterol . Smoking may contribute. 2025 spirometry was normal. Daily controller medication(s): Symbicort 160mcg 2 puffs once a day and rinse mouth after each use. During respiratory infections/flares:  Start Symbicort 160mcg 2 puffs twice a day and rinse mouth after each use for 1-2 weeks until your breathing symptoms return to baseline.  May use albuterol  rescue inhaler 2 puffs every 4 to 6 hours as needed for shortness of breath, chest tightness, coughing, and wheezing. May use albuterol  rescue inhaler 2 puffs 5 to 15 minutes prior to strenuous physical activities. Monitor frequency of use - if you need to use it more than twice per week on a consistent basis let us  know.   Return in about 6 months (around 11/30/2024).  No orders of the defined types were placed in this encounter.  Lab Orders  No laboratory test(s) ordered today    Diagnostics: Skin Testing: Environmental allergy  panel. Today's skin testing positive to grass, ragweed, weed, trees, mold, dog, horse. Borderline to dust mites.  Results discussed with patient/family. Test Information  Time  Antigen Placed 1422  Allergen Manufacturer Greer  Location Back  Number of Test 55  Routine  1. Control-Buffer  50% Glycerol Negative  2. Control-Histamine 2+  Grasses  3. Bahia 2+  4. [place] 3+  5. Johnson Negative  6. [place]  Blue 3+  7. Meadow Fescue 4+  8. Perennial Rye Negative  9. Timothy Negative  Weeds  10. Ragweed Mix Negative  11. Cocklebur Negative  12. Plantain,  English Negative  13. Baccharis Negative  14. Dog Fennel Negative  15. Russian Thistle Negative  16. Lamb's Quarters Negative  17. Sheep Sorrell Negative  18. Rough Pigweed Negative  19. Marsh Elder, Rough Negative  20. Mugwort, Common Negative  Trees  21. Box, Elder Negative  22. Cedar, red Negative  23. Sweet Gum Negative  24. Pecan Pollen Negative  25. Pine Mix Negative  26. Walnut, Black Pollen Negative  27. Red Mulberry Negative  28. Ash Mix Negative  29. Birch Mix Negative  30. Beech American Negative  31. Cottonwood, Guinea-Bissau Negative  32. Hickory, White Negative  33. Maple Mix Negative  34. Oak, Guinea-[place]issau Mix Negative  35. Sycamore Eastern Negative  Major Molds Mix (seasonal) 1  36. Alternaria Alternata Negative  37. Cladosporium Herbarum Negative  Major Molds Mix (perennial ) 2  38. Aspergillus Mix Negative  39. Penicillium Mix Negative  Major Mold Mix (seasonal) 3  40. Bipolaris Sorokiniana (Helminthosporium) --  +/-  41. Drechslera Spicifera (Curvularia) 2+  42. Mucor Plumbeus Negative  Major Molds Mix (perennial ) 4  43. Fusarium Moniliforme --  +/-  44. Aureobasidium Pullulans (pullulara) Negative  45. Rhizopus Oryzae Negative  Other Molds  46. Botrytis Cinera 2+  47. Epicoccum Nigrum Negative  48. Phoma Betae --  +/-  Inhalants  49. Dust Mite Mix Negative  50. Cat Hair 10,000 BAU/ml Negative  51.  Dog Epithelia Negative  52. Mixed Feathers Negative  53. Horse Epithelia 2+  54. Cockroach, German Negative  55. Tobacco Leaf Negative     Intradermal - 05/30/24 1515     Time Antigen Placed 1502    Allergen Manufacturer Jestine    Location Arm    Number of Test 11    Control  Negative    Johnson 2+    Ragweed Mix 2+    Weed Mix 2+    Tree Mix 2+    Mold 1 4+    Mold 2 3+    Mite Mix --   +/-   Cat Negative    Dog 2+    Cockroach Negative          Previous notes and tests were reviewed. The plan was reviewed with the patient/family, and all questions/concerned were addressed.  It was my pleasure to see Carolyn Lawrence today and participate in her care. Please feel free to contact me with any questions or concerns.  Sincerely,  Orlan Cramp, DO Allergy  & Immunology  [facility]  [place] office: [phone] [facility] office: [phone]

## 2024-08-05 ENCOUNTER — Other Ambulatory Visit: Payer: Self-pay | Admitting: *Deleted

## 2024-08-05 DIAGNOSIS — Z131 Encounter for screening for diabetes mellitus: Secondary | ICD-10-CM

## 2024-08-05 DIAGNOSIS — E559 Vitamin D deficiency, unspecified: Secondary | ICD-10-CM

## 2024-08-05 DIAGNOSIS — J4541 Moderate persistent asthma with (acute) exacerbation: Secondary | ICD-10-CM

## 2024-08-05 DIAGNOSIS — Z13220 Encounter for screening for lipoid disorders: Secondary | ICD-10-CM

## 2024-08-05 DIAGNOSIS — F17200 Nicotine dependence, unspecified, uncomplicated: Secondary | ICD-10-CM

## 2024-08-08 ENCOUNTER — Other Ambulatory Visit: Payer: BC Managed Care – PPO

## 2024-08-08 DIAGNOSIS — Z131 Encounter for screening for diabetes mellitus: Secondary | ICD-10-CM

## 2024-08-08 DIAGNOSIS — Z13220 Encounter for screening for lipoid disorders: Secondary | ICD-10-CM

## 2024-08-08 DIAGNOSIS — F17200 Nicotine dependence, unspecified, uncomplicated: Secondary | ICD-10-CM

## 2024-08-08 DIAGNOSIS — E559 Vitamin D deficiency, unspecified: Secondary | ICD-10-CM

## 2024-08-09 ENCOUNTER — Ambulatory Visit: Payer: Self-pay | Admitting: Family Medicine

## 2024-08-09 LAB — COMPREHENSIVE METABOLIC PANEL WITH GFR
ALT: 10 IU/L (ref 0–32)
AST: 15 IU/L (ref 0–40)
Albumin: 4.1 g/dL (ref 3.9–4.9)
Alkaline Phosphatase: 41 IU/L (ref 41–116)
BUN/Creatinine Ratio: 14 (ref 9–23)
BUN: 12 mg/dL (ref 6–24)
Bilirubin Total: 0.8 mg/dL (ref 0.0–1.2)
CO2: 23 mmol/L (ref 20–29)
Calcium: 9.1 mg/dL (ref 8.7–10.2)
Chloride: 104 mmol/L (ref 96–106)
Creatinine, Ser: 0.88 mg/dL (ref 0.57–1.00)
Globulin, Total: 1.9 g/dL (ref 1.5–4.5)
Glucose: 107 mg/dL — ABNORMAL HIGH (ref 70–99)
Potassium: 4.3 mmol/L (ref 3.5–5.2)
Sodium: 137 mmol/L (ref 134–144)
Total Protein: 6 g/dL (ref 6.0–8.5)
eGFR: 85 mL/min/1.73 (ref >59)

## 2024-08-09 LAB — CBC WITH DIFFERENTIAL/PLATELET
Basophils Absolute: 0 x10E3/uL (ref 0.0–0.2)
Basos: 1 %
EOS (ABSOLUTE): 0.4 x10E3/uL (ref 0.0–0.4)
Eos: 6 %
Hematocrit: 42.4 % (ref 34.0–46.6)
Hemoglobin: 13.7 g/dL (ref 11.1–15.9)
Immature Grans (Abs): 0 x10E3/uL (ref 0.0–0.1)
Immature Granulocytes: 0 %
Lymphocytes Absolute: 2.6 x10E3/uL (ref 0.7–3.1)
Lymphs: 39 %
MCH: 31.6 pg (ref 26.6–33.0)
MCHC: 32.3 g/dL (ref 31.5–35.7)
MCV: 98 fL — ABNORMAL HIGH (ref 79–97)
Monocytes Absolute: 0.8 x10E3/uL (ref 0.1–0.9)
Monocytes: 12 %
Neutrophils Absolute: 2.8 x10E3/uL (ref 1.4–7.0)
Neutrophils: 42 %
Platelets: 245 x10E3/uL (ref 150–450)
RBC: 4.34 x10E6/uL (ref 3.77–5.28)
RDW: 12.8 % (ref 11.7–15.4)
WBC: 6.6 x10E3/uL (ref 3.4–10.8)

## 2024-08-09 LAB — HEMOGLOBIN A1C
Est. average glucose Bld gHb Est-mCnc: 103 mg/dL
Hgb A1c MFr Bld: 5.2 % (ref 4.8–5.6)

## 2024-08-09 LAB — LIPID PANEL
Chol/HDL Ratio: 2.5 ratio (ref 0.0–4.4)
Cholesterol, Total: 162 mg/dL (ref 100–199)
HDL: 65 mg/dL (ref >39)
LDL Chol Calc (NIH): 76 mg/dL (ref 0–99)
Triglycerides: 118 mg/dL (ref 0–149)
VLDL Cholesterol Cal: 21 mg/dL (ref 5–40)

## 2024-08-09 LAB — VITAMIN D 25 HYDROXY (VIT D DEFICIENCY, FRACTURES): Vit D, 25-Hydroxy: 34.6 ng/mL (ref 30.0–100.0)

## 2024-08-15 ENCOUNTER — Encounter: Payer: BC Managed Care – PPO | Admitting: Family Medicine

## 2024-08-22 ENCOUNTER — Ambulatory Visit: Admitting: Family Medicine

## 2024-08-22 ENCOUNTER — Encounter: Payer: Self-pay | Admitting: Family Medicine

## 2024-08-22 VITALS — BP 112/75 | HR 77 | Ht 65.35 in | Wt 128.1 lb

## 2024-08-22 DIAGNOSIS — F17209 Nicotine dependence, unspecified, with unspecified nicotine-induced disorders: Secondary | ICD-10-CM | POA: Diagnosis not present

## 2024-08-22 DIAGNOSIS — J4541 Moderate persistent asthma with (acute) exacerbation: Secondary | ICD-10-CM | POA: Diagnosis not present

## 2024-08-22 DIAGNOSIS — H6990 Unspecified Eustachian tube disorder, unspecified ear: Secondary | ICD-10-CM | POA: Insufficient documentation

## 2024-08-22 DIAGNOSIS — Z131 Encounter for screening for diabetes mellitus: Secondary | ICD-10-CM | POA: Diagnosis not present

## 2024-08-22 DIAGNOSIS — H6991 Unspecified Eustachian tube disorder, right ear: Secondary | ICD-10-CM | POA: Diagnosis not present

## 2024-08-22 DIAGNOSIS — E559 Vitamin D deficiency, unspecified: Secondary | ICD-10-CM | POA: Diagnosis not present

## 2024-08-22 DIAGNOSIS — Z13220 Encounter for screening for lipoid disorders: Secondary | ICD-10-CM | POA: Diagnosis not present

## 2024-08-22 DIAGNOSIS — Z23 Encounter for immunization: Secondary | ICD-10-CM | POA: Diagnosis not present

## 2024-08-22 DIAGNOSIS — Z0000 Encounter for general adult medical examination without abnormal findings: Secondary | ICD-10-CM | POA: Diagnosis not present

## 2024-08-22 NOTE — Assessment & Plan Note (Signed)
-   Labs reviewed, cholesterol noted to be at its highest point but still within a good range. Patient wishes to avoid medication and will focus on diet and exercise. - Smoking cessation discussed; has successfully quit daily smoking and transitioned to occasional social use. Encouraged complete cessation. - Flu shot administered. - Follow up in 6 months.

## 2024-08-22 NOTE — Assessment & Plan Note (Signed)
-   History of right-sided ear fullness and decreased hearing, consistent with Eustachian tube dysfunction, likely allergy -mediated. No current use of intranasal steroids or daily antihistamines. - Counselled on the mechanism of Eustachian tube dysfunction and the role of intranasal steroid sprays. Explained proper technique (spraying towards the ear, no deep sniffing). - Discussed Flonase , Nasonex, or Rhinocort as options. - Discussed trying daily Xyzal for at least one month to assess for improvement.  - Right ear canal noted to be occluded with hard cerumen, contributing to symptoms of fullness and decreased hearing. - Initial attempt at removal with a curette was unsuccessful due to hardness of the wax. - Attempted to soften with irrigation. - Successful removal of cerumen impaction post-irrigation. Reports immediate improvement in hearing.

## 2024-08-22 NOTE — Progress Notes (Unsigned)
 Annual physical  Subjective    Patient ID: Carolyn Lawrence, female    DOB: [birth year]  Age: 38 y.o. MRN: [id number]  Chief Complaint  Patient presents with   Annual Exam   HPI Carolyn Lawrence is a 38 y.o. old female here  for annual exam.   Subjective - Clogged right ear with decreased hearing, exacerbated by pressure. Occurs primarily in the right ear, with the left ear affected once or twice in the past year. Reports some improvement with a previously recommended ear wash kit, but symptoms persist. Denies use of nasal sprays.  - History of seasonal allergies and asthma.  - Recent diet has been poor due to increased fast food and convenience food consumption following parental deaths in 2024. Trying to improve diet.  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 Has started 10-30 minutes of Pilates more regularly in the last month and a half.  - Reports quitting smoking, previously smoked before bed. Had a couple of cigarettes socially over the weekend but had been abstinent for approximately 3 months prior.  Medications: Xyzal PRN for allergies, Symbicort PRN for asthma (last used a couple of months ago), Ibuprofen  PRN.  PMH, PSH, FH, Social Hx: PMHx: Seasonal allergies, asthma, history of swimmer's ear as a child requiring wicks. Last labs showed elevated cholesterol but within a good range. PSH: None mentioned. FH: Father died from complications of chemotherapy for stomach cancer with metastases to spine, toe, kidney, and mediastinum; had underlying severe emphysema. Mother died unexpectedly on 11/17/23. Social Hx: Quit smoking, with occasional social use. Works with livestock and hay. Experienced significant stress due to both parents passing away unexpectedly in 2024.  ROS: HEENT: Positive for right ear fullness and decreased hearing. Left ear feels itchy but not clogged. Negative for nasal spray use. Constitutional: No  other changes reported. Endocrine: Regular menstrual cycle.    The 10-year ASCVD risk score (Arnett DK, et al., 2019) is: 0.9%  Health Maintenance Due  Topic Date Due   HIV Screening  Never done   Hepatitis C Screening  Never done   Pneumococcal Vaccine (1 of 2 - PCV) Never done   HPV VACCINES (1 - Risk 3-dose SCDM series) Never done   COVID-19 Vaccine (6 - 2025-26 season) 07/11/2024      Objective:     BP 112/75   Pulse 77   Ht 5' 5.35 (1.66 m)   Wt 128 lb 1.9 oz (58.1 kg)   LMP 07/29/2024   SpO2 98%   BMI 21.09 kg/m  {Vitals History (Optional):23777}  Physical Exam Gen: alert, oriented HEENT: perrla, eomi, mmm. HEENT: Right ear canal occluded with hard cerumen. Left ear unremarkable. Removed right impacted cerumen with irrigation and curette.  CV: rrr, no murmur Pulm: lctab. No wheeze or crackles.  GI: soft, nbs.  Nontender to palpation MSK: strength equal b/l. Normal gait Ext: no pedal edema Skin: warm and dry, no rashes Psych: pleasant affect.  Spontaneous speech   No results found for any visits on 08/22/24.      Assessment & Plan:   Physical exam, annual  Immunization due -     Flu vaccine trivalent PF, 6mos and older(Flulaval,Afluria,Fluarix,Fluzone)  Tobacco use disorder, continuous Assessment & Plan: Only smokes occasionally.  Has not had cigarette in 3 months prior to this past weekend.    Healthcare maintenance Assessment & Plan: - Labs reviewed, cholesterol noted to be at its highest point but still within a  good range. Patient wishes to avoid medication and will focus on diet and exercise. - Smoking cessation discussed; has successfully quit daily smoking and transitioned to occasional social use. Encouraged complete cessation. - Flu shot administered. - Follow up in 6 months.   Dysfunction of right eustachian tube Assessment & Plan: - History of right-sided ear fullness and decreased hearing, consistent with Eustachian tube  dysfunction, likely allergy -mediated. No current use of intranasal steroids or daily antihistamines. - Counselled on the mechanism of Eustachian tube dysfunction and the role of intranasal steroid sprays. Explained proper technique (spraying towards the ear, no deep sniffing). - Discussed Flonase , Nasonex, or Rhinocort as options. - Discussed trying daily Xyzal for at least one month to assess for improvement.  - Right ear canal noted to be occluded with hard cerumen, contributing to symptoms of fullness and decreased hearing. - Initial attempt at removal with a curette was unsuccessful due to hardness of the wax. - Attempted to soften with irrigation. - Successful removal of cerumen impaction post-irrigation. Reports immediate improvement in hearing.      Return in about 6 months (around 02/20/2025) for hld.    Toribio MARLA Slain, MD

## 2024-08-22 NOTE — Patient Instructions (Addendum)
 It was nice to see you today,  We addressed the following topics today: -I got most of your earwax out.  It should not be causing any of your symptoms now.  I would recommend using Nasonex, Nasacort or Flonase  every day for at least 2 weeks to help with your pain or decreased hearing that may be caused by eustachian tube dysfunction.  Spray 2 sprays in the affected side pointing towards your ear.  You do not need to sniff afterwards.  That should not be going down the back of your throat.  All yeah  Have a great day,  Rolan Slain, MD

## 2024-08-22 NOTE — Assessment & Plan Note (Signed)
 Only smokes occasionally.  Has not had cigarette in 3 months prior to this past weekend.

## 2024-11-15 ENCOUNTER — Other Ambulatory Visit: Payer: Self-pay | Admitting: Family Medicine

## 2024-11-15 MED ORDER — VALACYCLOVIR HCL 500 MG PO TABS: 500.0000 mg | ORAL_TABLET | Freq: Two times a day (BID) | ORAL | 1 refills | Status: AC

## 2024-11-15 NOTE — Telephone Encounter (Signed)
 Copied from CRM [id number]. Topic: Clinical - Medication Refill >> Nov 15, 2024  8:18 AM Tiffini S wrote: Medication: valACYclovir  (VALTREX ) 500 MG tablet- having a outbreak and is taking two tablet. Will be traveling soon  on 11/28/24 and need a earlier refill  Has the patient contacted their pharmacy? Yes (Agent: If no, request that the patient contact the pharmacy for the refill. If patient does not wish to contact the pharmacy document the reason why and proceed with request.) (Agent: If yes, when and what did the pharmacy advise?)  This is the patient's preferred pharmacy:  [facility] [phone] [facility] [place] - [address] [place] [address] Phone: [phone] Fax: [phone]  Is this the correct pharmacy for this prescription? Yes If no, delete pharmacy and type the correct one.   Has the prescription been filled recently? Yes  Is the patient out of the medication? Yes  Has the patient been seen for an appointment in the last year OR does the patient have an upcoming appointment? Yes  Can we respond through MyChart? Yes  Agent: Please be advised that Rx refills may take up to 3 business days. We ask that you follow-up with your pharmacy.

## 2024-11-29 NOTE — Progress Notes (Unsigned)
 "  Follow Up Note  RE: Carolyn Lawrence MRN: [id number] DOB: November 18, 1985 Date of Office Visit: 11/30/2024  Referring provider: Chandra Toribio POUR, MD Primary care provider: Chandra Toribio POUR, MD  Chief Complaint: No chief complaint on file.  History of Present Illness: I had the pleasure of seeing Carolyn Lawrence for a follow up visit at the [facility] on 11/30/2024. She is a 39 y.o. female, who is being followed for allergic rhinoconjunctivitis and asthma. Her previous allergy  office visit was on 05/30/2024 with Dr. Luke. Today is a regular follow up visit.  Discussed the use of AI scribe software for clinical note transcription with the patient, who gave verbal consent to proceed.  History of Present Illness            ***  Assessment and Plan: Carolyn Lawrence is a 39 y.o. female with: Seasonal and perennial allergic rhinoconjunctivitis Past history - symptoms of itchy, watery eyes, and runny nose during high pollen seasons, likely exacerbated by exposure to livestock and hay. Managed with Xyzal, but not used recently. Today's skin testing positive to grass, ragweed, weed, trees, mold, dog, horse. Borderline to dust mites.  Start environmental control measures as below. Use over the counter antihistamines such as Zyrtec (cetirizine), Claritin (loratadine), Allegra (fexofenadine ), or Xyzal (levocetirizine) daily as needed. May take twice a day during allergy  flares. May switch antihistamines every few months. Recommend allergy  injections. 2 injections.  Had a detailed discussion with patient/family that clinical history is suggestive of allergic rhinitis, and may benefit from allergy  immunotherapy (AIT). Discussed in detail regarding the dosing, schedule, side effects (mild to moderate local allergic reaction and rarely systemic allergic reactions including anaphylaxis), and benefits (significant improvement in nasal symptoms, seasonal flares of asthma) of immunotherapy with the  patient. There is significant time commitment involved with allergy  shots, which includes weekly immunotherapy injections for first 9-12 months and then biweekly to monthly injections for 3-5 years.    Moderate persistent asthma without complication Past history - exacerbated by allergens, primarily during spring and high pollen seasons. Symptoms improved with Symbicort  and albuterol . Smoking may contribute. 2025 spirometry was normal. Daily controller medication(s): Symbicort  160mcg 2 puffs once a day and rinse mouth after each use. During respiratory infections/flares:  Start Symbicort  160mcg 2 puffs twice a day and rinse mouth after each use for 1-2 weeks until your breathing symptoms return to baseline.  May use albuterol  rescue inhaler 2 puffs every 4 to 6 hours as needed for shortness of breath, chest tightness, coughing, and wheezing. May use albuterol  rescue inhaler 2 puffs 5 to 15 minutes prior to strenuous physical activities. Monitor frequency of use - if you need to use it more than twice per week on a consistent basis let us  know.  Assessment and Plan              No follow-ups on file.  No orders of the defined types were placed in this encounter.  Lab Orders  No laboratory test(s) ordered today    Diagnostics: Spirometry:  Tracings reviewed. Her effort: {Blank single:19197::Good reproducible efforts.,It was hard to get consistent efforts and there is a question as to whether this reflects a maximal maneuver.,Poor effort, data can not be interpreted.} FVC: ***L FEV1: ***L, ***% predicted FEV1/FVC ratio: ***% Interpretation: {Blank single:19197::Spirometry consistent with mild obstructive disease,Spirometry consistent with moderate obstructive disease,Spirometry consistent with severe obstructive disease,Spirometry consistent with possible restrictive disease,Spirometry consistent with mixed obstructive and restrictive disease,Spirometry uninterpretable  due to  technique,Spirometry consistent with normal pattern,No overt abnormalities noted given today's efforts}.  Please see scanned spirometry results for details.  Skin Testing: {Blank single:19197::Select foods,Environmental allergy  panel,Environmental allergy  panel and select foods,Food allergy  panel,None,Deferred due to recent antihistamines use}. *** Results discussed with patient/family.   Medication List:  Current Outpatient Medications  Medication Sig Dispense Refill   budesonide -formoterol  (SYMBICORT ) 160-4.5 MCG/ACT inhaler INHALE 2 PUFFS IN THE MORNING AND 2 PUFFS BEFORE BEDTIME. RINSE MOUTH WITH WATER AFTER USE TO REDUCE AFTERTASTE AND INCIDENCE OF CANDIDIASIS. DO NOT SWALLOW.. 10.2 each 5   ibuprofen  (ADVIL ) 800 MG tablet Take 1 tablet (800 mg total) by mouth 3 (three) times daily. Office visit needed for refills 270 tablet 0   valACYclovir  (VALTREX ) 500 MG tablet Take 1 tablet (500 mg total) by mouth 2 (two) times daily. 60 tablet 1   No current facility-administered medications for this visit.   Allergies: Allergies[1] I reviewed her past medical history, social history, family history, and environmental history and no significant changes have been reported from her previous visit.  Review of Systems  Constitutional:  Negative for appetite change, chills, fever and unexpected weight change.  HENT:  Positive for congestion, rhinorrhea and sneezing.   Eyes:  Positive for itching.  Respiratory:  Positive for chest tightness, shortness of breath and wheezing. Negative for cough.   Cardiovascular:  Negative for chest pain.  Gastrointestinal:  Negative for abdominal pain.  Genitourinary:  Negative for difficulty urinating.  Skin:  Negative for rash.  Allergic/Immunologic: Positive for environmental allergies.  Neurological:  Negative for headaches.    Objective: There were no vitals taken for this visit. There is no height or weight on file to  calculate BMI. Physical Exam Vitals and nursing note reviewed.  Constitutional:      Appearance: Normal appearance. She is well-developed.  HENT:     Head: Normocephalic and atraumatic.     Right Ear: Tympanic membrane and external ear normal.     Left Ear: Tympanic membrane and external ear normal.     Nose: Nose normal.     Mouth/Throat:     Mouth: Mucous membranes are moist.     Pharynx: Oropharynx is clear.  Eyes:     Conjunctiva/sclera: Conjunctivae normal.  Cardiovascular:     Rate and Rhythm: Normal rate and regular rhythm.     Heart sounds: Normal heart sounds. No murmur heard.    No friction rub. No gallop.  Pulmonary:     Effort: Pulmonary effort is normal.     Breath sounds: Normal breath sounds. No wheezing, rhonchi or rales.  Musculoskeletal:     Cervical back: Neck supple.  Skin:    General: Skin is warm.     Findings: No rash.  Neurological:     Mental Status: She is alert and oriented to person, place, and time.  Psychiatric:        Behavior: Behavior normal.   Previous notes and tests were reviewed. The plan was reviewed with the patient/family, and all questions/concerned were addressed.  It was my pleasure to see Carolyn Lawrence today and participate in her care. Please feel free to contact me with any questions or concerns.  Sincerely,  Orlan Cramp, DO Allergy  & Immunology  [facility]  [facility] office: [phone] [facility] office: [phone]     [1] No Known Allergies "

## 2024-11-30 ENCOUNTER — Other Ambulatory Visit: Payer: Self-pay

## 2024-11-30 ENCOUNTER — Encounter: Payer: Self-pay | Admitting: Allergy

## 2024-11-30 ENCOUNTER — Ambulatory Visit: Admitting: Allergy

## 2024-11-30 VITALS — BP 122/82 | HR 82 | Temp 98.1°F | Resp 20 | Ht 66.0 in | Wt 130.4 lb

## 2024-11-30 DIAGNOSIS — J4540 Moderate persistent asthma, uncomplicated: Secondary | ICD-10-CM | POA: Diagnosis not present

## 2024-11-30 DIAGNOSIS — J3089 Other allergic rhinitis: Secondary | ICD-10-CM | POA: Diagnosis not present

## 2024-11-30 DIAGNOSIS — J302 Other seasonal allergic rhinitis: Secondary | ICD-10-CM

## 2024-11-30 DIAGNOSIS — H1010 Acute atopic conjunctivitis, unspecified eye: Secondary | ICD-10-CM | POA: Diagnosis not present

## 2024-11-30 MED ORDER — BUDESONIDE-FORMOTEROL FUMARATE 160-4.5 MCG/ACT IN AERO: 2.0000 | INHALATION_SPRAY | Freq: Two times a day (BID) | RESPIRATORY_TRACT | 5 refills | Status: AC

## 2024-11-30 NOTE — Patient Instructions (Addendum)
 Environmental allergies 2025 skin testing positive to grass, ragweed, weed, trees, mold, dog, horse. Borderline to dust mites.  Continue environmental control measures as below. Use over the counter antihistamines such as Zyrtec (cetirizine), Claritin (loratadine), Allegra (fexofenadine ), or Xyzal (levocetirizine) daily as needed. May take twice a day during allergy  flares. May switch antihistamines every few months. Check pollen levels at [employer]   Asthma Daily controller medication(s): Symbicort  160mcg 2 puffs once a day and rinse mouth after each use. Take on Monday/Wednesday/Friday.  During respiratory infections/flares:  Start Symbicort  160mcg 2 puffs twice a day and rinse mouth after each use for 1-2 weeks until your breathing symptoms return to baseline.  May use albuterol  rescue inhaler 2 puffs every 4 to 6 hours as needed for shortness of breath, chest tightness, coughing, and wheezing. May use albuterol  rescue inhaler 2 puffs 5 to 15 minutes prior to strenuous physical activities. Monitor frequency of use - if you need to use it more than twice per week on a consistent basis let us  know.  Breathing control goals:  Full participation in all desired activities (may need albuterol  before activity) Albuterol  use two times or less a week on average (not counting use with activity) Cough interfering with sleep two times or less a month Oral steroids no more than once a year No hospitalizations   Return in about 6 months (around 05/30/2025). Or sooner if needed.   Reducing Pollen Exposure Pollen seasons: trees (spring), grass (summer) and ragweed/weeds (fall). Keep windows closed in your home and car to lower pollen exposure.  Install air conditioning in the bedroom and throughout the house if possible.  Avoid going out in dry windy days - especially early morning. Pollen counts are highest between 5 - 10 AM and on dry, hot and windy days.  Save outside activities for late afternoon  or after a heavy rain, when pollen levels are lower.  Avoid mowing of grass if you have grass pollen allergy . Be aware that pollen can also be transported indoors on people and pets.  Dry your clothes in an automatic dryer rather than hanging them outside where they might collect pollen.  Rinse hair and eyes before bedtime. Mold Control Mold and fungi can grow on a variety of surfaces provided certain temperature and moisture conditions exist.  Outdoor molds grow on plants, decaying vegetation and soil. The major outdoor mold, Alternaria and Cladosporium, are found in very high numbers during hot and dry conditions. Generally, a late summer - fall peak is seen for common outdoor fungal spores. Rain will temporarily lower outdoor mold spore count, but counts rise rapidly when the rainy period ends. The most important indoor molds are Aspergillus and Penicillium. Dark, humid and poorly ventilated basements are ideal sites for mold growth. The next most common sites of mold growth are the bathroom and the kitchen. Outdoor (Seasonal) Mold Control Use air conditioning and keep windows closed. Avoid exposure to decaying vegetation. Avoid leaf raking. Avoid grain handling. Consider wearing a face mask if working in moldy areas.  Indoor (Perennial) Mold Control  Maintain humidity below 50%. Get rid of mold growth on hard surfaces with water, detergent and, if necessary, 5% bleach (do not mix with other cleaners). Then dry the area completely. If mold covers an area more than 10 square feet, consider hiring an indoor environmental professional. For clothing, washing with soap and water is best. If moldy items cannot be cleaned and dried, throw them away. Remove sources e.g. contaminated carpets. Repair and  seal leaking roofs or pipes. Using dehumidifiers in damp basements may be helpful, but empty the water and clean units regularly to prevent mildew from forming. All rooms, especially basements,  bathrooms and kitchens, require ventilation and cleaning to deter mold and mildew growth. Avoid carpeting on concrete or damp floors, and storing items in damp areas. Control of House Dust Mite Allergen Dust mite allergens are a common trigger of allergy  and asthma symptoms. While they can be found throughout the house, these microscopic creatures thrive in warm, humid environments such as bedding, upholstered furniture and carpeting. Because so much time is spent in the bedroom, it is essential to reduce mite levels there.  Encase pillows, mattresses, and box springs in special allergen-proof fabric covers or airtight, zippered plastic covers.  Bedding should be washed weekly in hot water (130 F) and dried in a hot dryer. Allergen-proof covers are available for comforters and pillows that cant be regularly washed.  Wash the allergy -proof covers every few months. Minimize clutter in the bedroom. Keep pets out of the bedroom.  Keep humidity less than 50% by using a dehumidifier or air conditioning. You can buy a humidity measuring device called a hygrometer to monitor this.  If possible, replace carpets with hardwood, linoleum, or washable area rugs. If that's not possible, vacuum frequently with a vacuum that has a HEPA filter. Remove all upholstered furniture and non-washable window drapes from the bedroom. Remove all non-washable stuffed toys from the bedroom.  Wash stuffed toys weekly. Pet Allergen Avoidance: Contrary to popular opinion, there are no hypoallergenic breeds of dogs or cats. That is because people are not allergic to an animals hair, but to an allergen found in the animal's saliva, dander (dead skin flakes) or urine. Pet allergy  symptoms typically occur within minutes. For some people, symptoms can build up and become most severe 8 to 12 hours after contact with the animal. People with severe allergies can experience reactions in public places if dander has been transported on the  pet owners clothing. Keeping an animal outdoors is only a partial solution, since homes with pets in the yard still have higher concentrations of animal allergens. Before getting a pet, ask your allergist to determine if you are allergic to animals. If your pet is already considered part of your family, try to minimize contact and keep the pet out of the bedroom and other rooms where you spend a great deal of time. As with dust mites, vacuum carpets often or replace carpet with a hardwood floor, tile or linoleum. High-efficiency particulate air (HEPA) cleaners can reduce allergen levels over time. While dander and saliva are the source of cat and dog allergens, urine is the source of allergens from rabbits, hamsters, mice and guinea pigs; so ask a non-allergic family member to clean the animals cage. If you have a pet allergy , talk to your allergist about the potential for allergy  immunotherapy (allergy  shots). This strategy can often provide long-term relief.

## 2025-02-20 ENCOUNTER — Ambulatory Visit: Admitting: Family Medicine

## 2025-05-31 ENCOUNTER — Ambulatory Visit: Admitting: Allergy

## 2025-08-14 ENCOUNTER — Other Ambulatory Visit

## 2025-08-22 ENCOUNTER — Ambulatory Visit: Admitting: Family Medicine
